# Patient Record
Sex: Female | Born: 1970 | Race: Black or African American | Hispanic: No | Marital: Married | Smoking: Never smoker
Health system: Southern US, Community
[De-identification: ages and names within clinical notes are randomized; demographics above are authoritative.]

## PROBLEM LIST (undated history)

## (undated) DIAGNOSIS — E119 Type 2 diabetes mellitus without complications: Secondary | ICD-10-CM

## (undated) DIAGNOSIS — T7840XA Allergy, unspecified, initial encounter: Secondary | ICD-10-CM

## (undated) DIAGNOSIS — F329 Major depressive disorder, single episode, unspecified: Secondary | ICD-10-CM

## (undated) DIAGNOSIS — E559 Vitamin D deficiency, unspecified: Secondary | ICD-10-CM

## (undated) DIAGNOSIS — F32A Depression, unspecified: Secondary | ICD-10-CM

## (undated) DIAGNOSIS — J302 Other seasonal allergic rhinitis: Secondary | ICD-10-CM

## (undated) DIAGNOSIS — M1990 Unspecified osteoarthritis, unspecified site: Secondary | ICD-10-CM

## (undated) DIAGNOSIS — G4730 Sleep apnea, unspecified: Secondary | ICD-10-CM

## (undated) DIAGNOSIS — A630 Anogenital (venereal) warts: Secondary | ICD-10-CM

## (undated) DIAGNOSIS — K219 Gastro-esophageal reflux disease without esophagitis: Secondary | ICD-10-CM

## (undated) DIAGNOSIS — I10 Essential (primary) hypertension: Secondary | ICD-10-CM

## (undated) HISTORY — DX: Allergy, unspecified, initial encounter: T78.40XA

## (undated) HISTORY — DX: Anogenital (venereal) warts: A63.0

## (undated) HISTORY — DX: Unspecified osteoarthritis, unspecified site: M19.90

## (undated) HISTORY — PX: TUBAL LIGATION: SHX77

## (undated) HISTORY — PX: DILATION AND CURETTAGE OF UTERUS: SHX78

---

## 2015-08-29 ENCOUNTER — Emergency Department (HOSPITAL_COMMUNITY)
Admission: EM | Admit: 2015-08-29 | Discharge: 2015-08-29 | Disposition: A | Discharge status: Home / Self Care | Payer: BLUE CROSS/BLUE SHIELD | Attending: Emergency Medicine | Admitting: Emergency Medicine

## 2015-08-29 ENCOUNTER — Encounter (HOSPITAL_COMMUNITY): Payer: Self-pay | Admitting: Nurse Practitioner

## 2015-08-29 ENCOUNTER — Emergency Department (HOSPITAL_COMMUNITY): Payer: BLUE CROSS/BLUE SHIELD

## 2015-08-29 DIAGNOSIS — D259 Leiomyoma of uterus, unspecified: Secondary | ICD-10-CM

## 2015-08-29 DIAGNOSIS — Z9851 Tubal ligation status: Secondary | ICD-10-CM | POA: Diagnosis not present

## 2015-08-29 DIAGNOSIS — R52 Pain, unspecified: Secondary | ICD-10-CM

## 2015-08-29 DIAGNOSIS — E119 Type 2 diabetes mellitus without complications: Secondary | ICD-10-CM | POA: Insufficient documentation

## 2015-08-29 DIAGNOSIS — R1031 Right lower quadrant pain: Secondary | ICD-10-CM | POA: Diagnosis not present

## 2015-08-29 DIAGNOSIS — R1110 Vomiting, unspecified: Secondary | ICD-10-CM | POA: Insufficient documentation

## 2015-08-29 DIAGNOSIS — R140 Abdominal distension (gaseous): Secondary | ICD-10-CM | POA: Insufficient documentation

## 2015-08-29 DIAGNOSIS — IMO0002 Reserved for concepts with insufficient information to code with codable children: Secondary | ICD-10-CM

## 2015-08-29 DIAGNOSIS — R229 Localized swelling, mass and lump, unspecified: Secondary | ICD-10-CM

## 2015-08-29 DIAGNOSIS — R1030 Lower abdominal pain, unspecified: Secondary | ICD-10-CM | POA: Diagnosis present

## 2015-08-29 DIAGNOSIS — Z8659 Personal history of other mental and behavioral disorders: Secondary | ICD-10-CM | POA: Insufficient documentation

## 2015-08-29 HISTORY — DX: Type 2 diabetes mellitus without complications: E11.9

## 2015-08-29 HISTORY — DX: Depression, unspecified: F32.A

## 2015-08-29 HISTORY — DX: Major depressive disorder, single episode, unspecified: F32.9

## 2015-08-29 HISTORY — DX: Vitamin D deficiency, unspecified: E55.9

## 2015-08-29 HISTORY — DX: Other seasonal allergic rhinitis: J30.2

## 2015-08-29 LAB — COMPREHENSIVE METABOLIC PANEL
ALT: 14 U/L (ref 14–54)
ANION GAP: 9 (ref 5–15)
AST: 15 U/L (ref 15–41)
Albumin: 4 g/dL (ref 3.5–5.0)
Alkaline Phosphatase: 68 U/L (ref 38–126)
BUN: 6 mg/dL (ref 6–20)
CHLORIDE: 98 mmol/L — AB (ref 101–111)
CO2: 26 mmol/L (ref 22–32)
Calcium: 9.4 mg/dL (ref 8.9–10.3)
Creatinine, Ser: 0.67 mg/dL (ref 0.44–1.00)
GFR calc non Af Amer: >60 mL/min (ref >60)
Glucose, Bld: 159 mg/dL — ABNORMAL HIGH (ref 65–99)
POTASSIUM: 3.9 mmol/L (ref 3.5–5.1)
SODIUM: 133 mmol/L — AB (ref 135–145)
Total Bilirubin: 0.5 mg/dL (ref 0.3–1.2)
Total Protein: 7.5 g/dL (ref 6.5–8.1)

## 2015-08-29 LAB — CBC
HCT: 40.4 % (ref 36.0–46.0)
HEMOGLOBIN: 13.3 g/dL (ref 12.0–15.0)
MCH: 28.1 pg (ref 26.0–34.0)
MCHC: 32.9 g/dL (ref 30.0–36.0)
MCV: 85.4 fL (ref 78.0–100.0)
Platelets: 299 10*3/uL (ref 150–400)
RBC: 4.73 MIL/uL (ref 3.87–5.11)
RDW: 14 % (ref 11.5–15.5)
WBC: 14.3 10*3/uL — AB (ref 4.0–10.5)

## 2015-08-29 LAB — I-STAT BETA HCG BLOOD, ED (MC, WL, AP ONLY): I-stat hCG, quantitative: <5 m[IU]/mL (ref <5)

## 2015-08-29 MED ORDER — ONDANSETRON HCL 4 MG/2ML IJ SOLN
4.0000 mg | Freq: Once | INTRAMUSCULAR | Status: AC
  Administered 2015-08-29: 4 mg via INTRAVENOUS
  Filled 2015-08-29: qty 2

## 2015-08-29 MED ORDER — HYDROMORPHONE HCL 1 MG/ML IJ SOLN
1.0000 mg | Freq: Once | INTRAMUSCULAR | Status: AC
  Administered 2015-08-29: 1 mg via INTRAVENOUS
  Filled 2015-08-29: qty 1

## 2015-08-29 MED ORDER — IOHEXOL 300 MG/ML  SOLN
80.0000 mL | Freq: Once | INTRAMUSCULAR | Status: AC | PRN
  Administered 2015-08-29: 80 mL via INTRAVENOUS

## 2015-08-29 MED ORDER — ONDANSETRON HCL 4 MG PO TABS: 4.0000 mg | ORAL_TABLET | Freq: Three times a day (TID) | ORAL | Status: DC | PRN

## 2015-08-29 MED ORDER — OXYCODONE-ACETAMINOPHEN 5-325 MG PO TABS: 1.0000 | ORAL_TABLET | ORAL | Status: DC | PRN

## 2015-08-29 MED ORDER — NAPROXEN 500 MG PO TABS: 500.0000 mg | ORAL_TABLET | Freq: Two times a day (BID) | ORAL | Status: DC

## 2015-08-29 NOTE — Discharge Instructions (Signed)
Abdominal (belly) pain can be caused by many things. Your caregiver performed an examination and possibly ordered blood/urine tests and imaging (CT scan, x-rays, ultrasound). Many cases can be observed and treated at home after initial evaluation in the emergency department. Even though you are being discharged home, abdominal pain can be unpredictable. Therefore, you need a repeated exam if your pain does not resolve, returns, or worsens. Most patients with abdominal pain don't have to be admitted to the hospital or have surgery, but serious problems like appendicitis and gallbladder attacks can start out as nonspecific pain. Many abdominal conditions cannot be diagnosed in one visit, so follow-up evaluations are very important. SEEK IMMEDIATE MEDICAL ATTENTION IF: The pain does not go away or becomes severe.  A temperature above 101 develops.  Repeated vomiting occurs (multiple episodes).  The pain becomes localized to portions of the abdomen. The right side could possibly be appendicitis. In an adult, the left lower portion of the abdomen could be colitis or diverticulitis.  Blood is being passed in stools or vomit (bright red or black tarry stools).  Return also if you develop chest pain, difficulty breathing, dizziness or fainting, or become confused, poorly responsive, or inconsolable (young children).     Uterine Fibroids Uterine fibroids are tissue masses (tumors) that can develop in the womb (uterus). They are also called leiomyomas. This type of tumor is not cancerous (benign) and does not spread to other parts of the body outside of the pelvic area, which is between the hip bones. Occasionally, fibroids may develop in the fallopian tubes, in the cervix, or on the support structures (ligaments) that surround the uterus. You can have one or many fibroids. Fibroids can vary in size, weight, and where they grow in the uterus. Some can become quite large. Most fibroids do not require medical  treatment. CAUSES A fibroid can develop when a single uterine cell keeps growing (replicating). Most cells in the human body have a control mechanism that keeps them from replicating without control. SIGNS AND SYMPTOMS Symptoms may include:   Heavy bleeding during your period.  Bleeding or spotting between periods.  Pelvic pain and pressure.  Bladder problems, such as needing to urinate more often (urinary frequency) or urgently.  Inability to reproduce offspring (infertility).  Miscarriages. DIAGNOSIS Uterine fibroids are diagnosed through a physical exam. Your health care provider may feel the lumpy tumors during a pelvic exam. Ultrasonography and an MRI may be done to determine the size, location, and number of fibroids. TREATMENT Treatment may include:  Watchful waiting. This involves getting the fibroid checked by your health care provider to see if it grows or shrinks. Follow your health care provider's recommendations for how often to have this checked.  Hormone medicines. These can be taken by mouth or given through an intrauterine device (IUD).  Surgery.  Removing the fibroids (myomectomy) or the uterus (hysterectomy).  Removing blood supply to the fibroids (uterine artery embolization). If fibroids interfere with your fertility and you want to become pregnant, your health care provider may recommend having the fibroids removed.  HOME CARE INSTRUCTIONS  Keep all follow-up visits as directed by your health care provider. This is important.  Take medicines only as directed by your health care provider.  If you were prescribed a hormone treatment, take the hormone medicines exactly as directed.  Do not take aspirin, because it can cause bleeding.  Ask your health care provider about taking iron pills and increasing the amount of dark green, leafy vegetables  in your diet. These actions can help to boost your blood iron levels, which may be affected by heavy menstrual  bleeding.  Pay close attention to your period and tell your health care provider about any changes, such as:  Increased blood flow that requires you to use more pads or tampons than usual per month.  A change in the number of days that your period lasts per month.  A change in symptoms that are associated with your period, such as abdominal cramping or back pain. SEEK MEDICAL CARE IF:  You have pelvic pain, back pain, or abdominal cramps that cannot be controlled with medicines.  You have an increase in bleeding between and during periods.  You soak tampons or pads in a half hour or less.  You feel lightheaded, extra tired, or weak. SEEK IMMEDIATE MEDICAL CARE IF:  You faint.  You have a sudden increase in pelvic pain.   This information is not intended to replace advice given to you by your health care provider. Make sure you discuss any questions you have with your health care provider.   Document Released: 09/16/2000 Document Revised: 10/10/2014 Document Reviewed: 03/18/2014 Elsevier Interactive Patient Education [employer].

## 2015-08-29 NOTE — ED Provider Notes (Signed)
CSN: [id number]     Arrival date & time 08/29/15  1602 History   First MD Initiated Contact with Patient 08/29/15 1725     Chief Complaint  Patient presents with  . Abdominal Pain     (Consider location/radiation/quality/duration/timing/severity/associated sxs/prior Treatment) HPI  Natasha Briggs is a(n) 44 y.o. female who presents with CC of back pain and lower abdominal pain. She  has a past medical history of Diabetes mellitus without complication ([place]); Depression; Seasonal allergies; and Vitamin D deficiency. The patient states that she normally has some low back pain but it is worse than ususal. % days ago she began having lower abdominal pain which is worse on the R side. She states that it had been intermittent but is now constant. She states that at time it feels like "I am having a baby." She began vomiting today and the pain became severe. She has pain with movement, coughing. She has only surgical history of gynecologic surgeries. The pain does not radiate. She denies urinary or vaginal symptoms. She states she has only made urine once today, which is abnormal for her.   Past Medical History  Diagnosis Date  . Diabetes mellitus without complication ([place])   . Depression   . Seasonal allergies   . Vitamin D deficiency    Past Surgical History  Procedure Laterality Date  . Tubal ligation    . Dilation and curettage of uterus     History reviewed. No pertinent family history. Social History  Substance Use Topics  . Smoking status: Never Smoker   . Smokeless tobacco: None  . Alcohol Use: Yes   OB History    No data available     Review of Systems  Ten systems reviewed and are negative for acute change, except as noted in the HPI.    Allergies  Review of patient's allergies indicates no known allergies.  Home Medications   Prior to Admission medications   Not on File   BP 120/70 mmHg  Pulse 79  Temp(Src) 98.2 F (36.8 C) (Oral)  Resp 18  Ht 5\' 4"  (1.626  m)  Wt 108.773 kg  BMI 41.14 kg/m2  SpO2 98% Physical Exam  Constitutional: She is oriented to person, place, and time. She appears well-developed and well-nourished. No distress.  Tearful  HENT:  Head: Normocephalic and atraumatic.  Eyes: Conjunctivae are normal. No scleral icterus.  Neck: Normal range of motion.  Cardiovascular: Normal rate, regular rhythm and normal heart sounds.  Exam reveals no gallop and no friction rub.   No murmur heard. Pulmonary/Chest: Effort normal and breath sounds normal. No respiratory distress.  Abdominal: Soft. Bowel sounds are normal. She exhibits no distension and no mass. There is tenderness. There is guarding.    Right lower quadrant of the abdomen is tender, distended and rigid. No CVA tenderness. Minimal tenderness in the other quadrants of the abdomen  Neurological: She is alert and oriented to person, place, and time.  Skin: Skin is warm and dry. She is not diaphoretic.  Nursing note and vitals reviewed.   ED Course  Procedures (including critical care time) Labs Review Labs Reviewed  COMPREHENSIVE METABOLIC PANEL - Abnormal; Notable for the following:    Sodium 133 (*)    Chloride 98 (*)    Glucose, Bld 159 (*)    All other components within normal limits  CBC - Abnormal; Notable for the following:    WBC 14.3 (*)    All other components within normal limits  URINALYSIS, ROUTINE W REFLEX MICROSCOPIC (NOT AT [facility])  I-STAT BETA HCG BLOOD, ED ([facility], [facility], [facility] ONLY)  POC URINE PREG, ED    Imaging Review No results found. I have personally reviewed and evaluated these images and lab results as part of my medical decision-making.   EKG Interpretation None      MDM   Final diagnoses:  Uterine leiomyoma, unspecified location    6:23 PM BP 120/70 mmHg  Pulse 79  Temp(Src) 98.2 F (36.8 C) (Oral)  Resp 18  Ht 5\' 4"  (1.626 m)  Wt 108.773 kg  BMI 41.14 kg/m2  SpO2 98% Patient with RLQ tenderness, guarding, rigidity.  ?  Peritonitis vs. Mass. She has a sig. Leukocytosis.   7:19 PM BP 109/56 mmHg  Pulse 88  Temp(Src) 98.2 F (36.8 C) (Oral)  Resp 20  Ht 5\' 4"  (1.626 m)  Wt 108.773 kg  BMI 41.14 kg/m2  SpO2 97% Patient US shows no torsion, +fibroids. No other abnormalities accounting for her pain.She appears safe for discharge. Follow up as soon as possible with OB/GYN I have discussed return precautions  The patient appears reasonably screened and/or stabilized for discharge and I doubt any other medical condition or other [facility] requiring further screening, evaluation, or treatment in the ED at this time prior to discharge.    Margarita Mail, PA-C 08/31/15 1724  Merrily Pew, MD 09/01/15 1256

## 2015-08-29 NOTE — ED Notes (Signed)
She c/o 3 day history lower abd, back pain, n/v, unable to tolerate any oral intake. Reports decreased urinary frequency. Denies bowel changes, fevers

## 2020-03-03 ENCOUNTER — Emergency Department (HOSPITAL_COMMUNITY)
Admission: EM | Admit: 2020-03-03 | Discharge: 2020-03-03 | Disposition: A | Discharge status: Home / Self Care | Payer: BC Managed Care – PPO | Attending: Emergency Medicine | Admitting: Emergency Medicine

## 2020-03-03 ENCOUNTER — Other Ambulatory Visit: Payer: Self-pay

## 2020-03-03 ENCOUNTER — Emergency Department (HOSPITAL_COMMUNITY): Payer: BC Managed Care – PPO

## 2020-03-03 ENCOUNTER — Encounter (HOSPITAL_COMMUNITY): Payer: Self-pay | Admitting: Emergency Medicine

## 2020-03-03 DIAGNOSIS — R079 Chest pain, unspecified: Secondary | ICD-10-CM | POA: Diagnosis present

## 2020-03-03 DIAGNOSIS — Z5321 Procedure and treatment not carried out due to patient leaving prior to being seen by health care provider: Secondary | ICD-10-CM | POA: Diagnosis not present

## 2020-03-03 LAB — CBC
HCT: 40.4 % (ref 36.0–46.0)
Hemoglobin: 13.1 g/dL (ref 12.0–15.0)
MCH: 28.2 pg (ref 26.0–34.0)
MCHC: 32.4 g/dL (ref 30.0–36.0)
MCV: 87.1 fL (ref 80.0–100.0)
Platelets: 294 10*3/uL (ref 150–400)
RBC: 4.64 MIL/uL (ref 3.87–5.11)
RDW: 14.8 % (ref 11.5–15.5)
WBC: 9.6 10*3/uL (ref 4.0–10.5)
nRBC: 0 % (ref 0.0–0.2)

## 2020-03-03 LAB — BASIC METABOLIC PANEL
Anion gap: 11 (ref 5–15)
BUN: 10 mg/dL (ref 6–20)
CO2: 26 mmol/L (ref 22–32)
Calcium: 9 mg/dL (ref 8.9–10.3)
Chloride: 102 mmol/L (ref 98–111)
Creatinine, Ser: 0.69 mg/dL (ref 0.44–1.00)
GFR calc Af Amer: >60 mL/min (ref >60)
GFR calc non Af Amer: >60 mL/min (ref >60)
Glucose, Bld: 96 mg/dL (ref 70–99)
Potassium: 3.9 mmol/L (ref 3.5–5.1)
Sodium: 139 mmol/L (ref 135–145)

## 2020-03-03 LAB — TROPONIN I (HIGH SENSITIVITY): Troponin I (High Sensitivity): <2 ng/L (ref <18)

## 2020-03-03 MED ORDER — SODIUM CHLORIDE 0.9% FLUSH: 3.0000 mL | Freq: Once | INTRAVENOUS | Status: DC

## 2020-03-03 NOTE — ED Notes (Signed)
BhCG <5. ISTAT not crossing over.

## 2020-03-03 NOTE — ED Triage Notes (Signed)
Patient arrives to ED with complaints of shortness of breath for the past month and chest tightness starting yesterday. Patient states the chest tightness comes and goes. Patient states the chest tightness is substernal.

## 2020-03-03 NOTE — ED Notes (Signed)
Pt is leaving due to wait time

## 2020-03-04 LAB — I-STAT BETA HCG BLOOD, ED (MC, WL, AP ONLY): I-stat hCG, quantitative: <5 m[IU]/mL (ref <5)

## 2021-06-07 ENCOUNTER — Encounter (HOSPITAL_BASED_OUTPATIENT_CLINIC_OR_DEPARTMENT_OTHER): Payer: Self-pay | Admitting: Emergency Medicine

## 2021-06-07 ENCOUNTER — Emergency Department (HOSPITAL_BASED_OUTPATIENT_CLINIC_OR_DEPARTMENT_OTHER): Payer: BC Managed Care – PPO | Admitting: Radiology

## 2021-06-07 ENCOUNTER — Other Ambulatory Visit: Payer: Self-pay

## 2021-06-07 ENCOUNTER — Emergency Department (HOSPITAL_BASED_OUTPATIENT_CLINIC_OR_DEPARTMENT_OTHER)
Admission: EM | Admit: 2021-06-07 | Discharge: 2021-06-07 | Disposition: A | Discharge status: Home / Self Care | Payer: BC Managed Care – PPO | Attending: Emergency Medicine | Admitting: Emergency Medicine

## 2021-06-07 DIAGNOSIS — I10 Essential (primary) hypertension: Secondary | ICD-10-CM | POA: Insufficient documentation

## 2021-06-07 DIAGNOSIS — Z20822 Contact with and (suspected) exposure to covid-19: Secondary | ICD-10-CM | POA: Diagnosis not present

## 2021-06-07 DIAGNOSIS — J069 Acute upper respiratory infection, unspecified: Secondary | ICD-10-CM | POA: Diagnosis not present

## 2021-06-07 DIAGNOSIS — Z79899 Other long term (current) drug therapy: Secondary | ICD-10-CM | POA: Diagnosis not present

## 2021-06-07 DIAGNOSIS — E119 Type 2 diabetes mellitus without complications: Secondary | ICD-10-CM | POA: Diagnosis not present

## 2021-06-07 DIAGNOSIS — R059 Cough, unspecified: Secondary | ICD-10-CM | POA: Diagnosis present

## 2021-06-07 HISTORY — DX: Gastro-esophageal reflux disease without esophagitis: K21.9

## 2021-06-07 HISTORY — DX: Essential (primary) hypertension: I10

## 2021-06-07 LAB — CBC WITH DIFFERENTIAL/PLATELET
Abs Immature Granulocytes: 0.04 10*3/uL (ref 0.00–0.07)
Basophils Absolute: 0.1 10*3/uL (ref 0.0–0.1)
Basophils Relative: 1 %
Eosinophils Absolute: 0.5 10*3/uL (ref 0.0–0.5)
Eosinophils Relative: 9 %
HCT: 37.5 % (ref 36.0–46.0)
Hemoglobin: 12.2 g/dL (ref 12.0–15.0)
Immature Granulocytes: 1 %
Lymphocytes Relative: 21 %
Lymphs Abs: 1.1 10*3/uL (ref 0.7–4.0)
MCH: 26.8 pg (ref 26.0–34.0)
MCHC: 32.5 g/dL (ref 30.0–36.0)
MCV: 82.2 fL (ref 80.0–100.0)
Monocytes Absolute: 0.6 10*3/uL (ref 0.1–1.0)
Monocytes Relative: 11 %
Neutro Abs: 3.2 10*3/uL (ref 1.7–7.7)
Neutrophils Relative %: 57 %
Platelets: 309 10*3/uL (ref 150–400)
RBC: 4.56 MIL/uL (ref 3.87–5.11)
RDW: 14.7 % (ref 11.5–15.5)
WBC: 5.4 10*3/uL (ref 4.0–10.5)
nRBC: 0 % (ref 0.0–0.2)

## 2021-06-07 LAB — RESP PANEL BY RT-PCR (FLU A&B, COVID) ARPGX2
Influenza A by PCR: NEGATIVE
Influenza B by PCR: NEGATIVE
SARS Coronavirus 2 by RT PCR: NEGATIVE

## 2021-06-07 LAB — BASIC METABOLIC PANEL
Anion gap: 9 (ref 5–15)
BUN: 11 mg/dL (ref 6–20)
CO2: 25 mmol/L (ref 22–32)
Calcium: 8.8 mg/dL — ABNORMAL LOW (ref 8.9–10.3)
Chloride: 100 mmol/L (ref 98–111)
Creatinine, Ser: 0.81 mg/dL (ref 0.44–1.00)
GFR, Estimated: >60 mL/min (ref >60)
Glucose, Bld: 270 mg/dL — ABNORMAL HIGH (ref 70–99)
Potassium: 3.9 mmol/L (ref 3.5–5.1)
Sodium: 134 mmol/L — ABNORMAL LOW (ref 135–145)

## 2021-06-07 LAB — BRAIN NATRIURETIC PEPTIDE: B Natriuretic Peptide: 20 pg/mL (ref 0.0–100.0)

## 2021-06-07 LAB — TROPONIN I (HIGH SENSITIVITY): Troponin I (High Sensitivity): 2 ng/L (ref <18)

## 2021-06-07 MED ORDER — ACETAMINOPHEN 325 MG PO TABS
650.0000 mg | ORAL_TABLET | Freq: Four times a day (QID) | ORAL | 0 refills | Status: DC | PRN
Start: 2021-06-07 — End: 2024-05-27

## 2021-06-07 MED ORDER — DEXAMETHASONE SODIUM PHOSPHATE 10 MG/ML IJ SOLN
10.0000 mg | Freq: Once | INTRAMUSCULAR | Status: AC
  Administered 2021-06-07: 10 mg
  Filled 2021-06-07: qty 1

## 2021-06-07 MED ORDER — IPRATROPIUM-ALBUTEROL 0.5-2.5 (3) MG/3ML IN SOLN
3.0000 mL | Freq: Once | RESPIRATORY_TRACT | Status: AC
  Administered 2021-06-07: 3 mL via RESPIRATORY_TRACT
  Filled 2021-06-07: qty 3

## 2021-06-07 MED ORDER — SODIUM CHLORIDE 0.9 % IV BOLUS
1000.0000 mL | Freq: Once | INTRAVENOUS | Status: AC
  Administered 2021-06-07: 1000 mL via INTRAVENOUS

## 2021-06-07 MED ORDER — KETOROLAC TROMETHAMINE 15 MG/ML IJ SOLN
15.0000 mg | Freq: Once | INTRAMUSCULAR | Status: AC
  Administered 2021-06-07: 15 mg via INTRAVENOUS
  Filled 2021-06-07: qty 1

## 2021-06-07 MED ORDER — GUAIFENESIN 100 MG/5ML PO SOLN
5.0000 mL | ORAL | 0 refills | Status: AC | PRN
Start: 2021-06-07 — End: ?

## 2021-06-07 NOTE — ED Triage Notes (Signed)
Pt to ER with c/o cough and SHOB that started on Wednesday and has been getting increasingly worse.  Pt states was difficult to sleep last night due to [facility]. Pt states cough is congested, but not productive.

## 2021-06-07 NOTE — ED Provider Notes (Signed)
[place] EMERGENCY DEPT Provider Note   CSN: [id number] Arrival date & time: 06/07/21  1217     History Chief Complaint  Patient presents with   Shortness of Breath    Natasha Briggs is a 50 y.o. female.  50 year old female with history of hypertension, acid reflux, diabetes pain to the ER secondary to cough, congestion, malaise over the past 4 to 5 days.  Patient with known sick contacts with family member with viral upper respiratory tract infection.  She has received COVID-19 immunization and booster.  Patient with history of underlying asthma uses inhaler sparingly.  No history of COPD.  No home oxygen use.  Patient reports that when she is coughing she has difficulty breathing but otherwise she is breathing appropriately.  No chest pain, fevers, chills, nausea or vomiting.  She is eating oral intake appropriately.  No change in bowel or bladder function.  No rashes, no recent travel.  She does not smoke tobacco  The history is provided by the spouse and the patient. No language interpreter was used.  Shortness of Breath Associated symptoms: cough   Associated symptoms: no abdominal pain, no chest pain, no fever, no headaches and no rash       Past Medical History:  Diagnosis Date   Depression    Diabetes mellitus without complication (HCC)    GERD (gastroesophageal reflux disease)    Hypertension    Seasonal allergies    Vitamin D deficiency     There are no problems to display for this patient.   Past Surgical History:  Procedure Laterality Date   DILATION AND CURETTAGE OF UTERUS     TUBAL LIGATION       OB History   No obstetric history on file.     History reviewed. No pertinent family history.  Social History   Tobacco Use   Smoking status: Never   Smokeless tobacco: Never  Substance Use Topics   Alcohol use: Yes   Drug use: No    Home Medications Prior to Admission medications   Medication Sig Start Date End Date Taking?  Authorizing Provider  acetaminophen (TYLENOL) 325 MG tablet Take 2 tablets (650 mg total) by mouth every 6 (six) hours as needed. 06/07/21  Yes Jeanell Sparrow, DO  citalopram (CELEXA) 20 MG tablet citalopram 20 mg tablet   1 tablet twice a day by oral route. 03/07/09  Yes [provider]  guaiFENesin (ROBITUSSIN) 100 MG/5ML SOLN Take 5 mLs (100 mg total) by mouth every 4 (four) hours as needed for cough or to loosen phlegm. 06/07/21  Yes Wynona Dove A, DO  pantoprazole (PROTONIX) 40 MG tablet Protonix 40 mg tablet,delayed release   1 tablet twice a day by oral route. 03/14/16  Yes [provider]  pravastatin (PRAVACHOL) 10 MG tablet pravastatin 10 mg tablet   1 tablet every day by oral route. 12/06/18  Yes [provider]  amLODipine (NORVASC) 2.5 MG tablet Take 2.5 mg by mouth daily. 02/25/21   [provider]  glyBURIDE (DIABETA) 5 MG tablet Take 2.5 mg by mouth daily as needed. 04/13/21   [provider]  loratadine (CLARITIN) 10 MG tablet Claritin 10 mg tablet   1 tablet every day by oral route.    [provider]  naproxen (NAPROSYN) 500 MG tablet Take 1 tablet (500 mg total) by mouth 2 (two) times daily with a meal. 08/29/15   Harris, Abigail, PA-C  ondansetron (ZOFRAN) 4 MG tablet Take 1 tablet (  4 mg total) by mouth every 8 (eight) hours as needed for nausea or vomiting. 08/29/15   Margarita Mail, PA-C  oxyCODONE-acetaminophen (PERCOCET) 5-325 MG tablet Take 1-2 tablets by mouth every 4 (four) hours as needed. 08/29/15   Margarita Mail, PA-C    Allergies    Patient has no known allergies.  Review of Systems   Review of Systems  Constitutional:  Negative for activity change and fever.  HENT:  Positive for congestion and postnasal drip. Negative for facial swelling and trouble swallowing.   Eyes:  Negative for discharge and redness.  Respiratory:  Positive for cough and shortness of breath.   Cardiovascular:  Negative for chest pain and  palpitations.  Gastrointestinal:  Negative for abdominal pain and nausea.  Genitourinary:  Negative for dysuria and flank pain.  Musculoskeletal:  Negative for back pain and gait problem.  Skin:  Negative for pallor and rash.  Neurological:  Negative for syncope and headaches.   Physical Exam Updated Vital Signs BP 105/60   Pulse 92   Temp 99.2 F (37.3 C) (Oral)   Resp 18   Ht '5\' 4"'$  (1.626 m)   Wt 112 kg   LMP 05/07/2021 (Exact Date)   SpO2 93%   BMI 42.40 kg/m   Physical Exam Vitals and nursing note reviewed.  Constitutional:      General: She is not in acute distress.    Appearance: Normal appearance. She is well-developed. She is not ill-appearing.  HENT:     Head: Normocephalic and atraumatic.     Right Ear: External ear normal.     Left Ear: External ear normal.     Nose: Nose normal.     Mouth/Throat:     Mouth: Mucous membranes are moist.     Pharynx: Oropharynx is clear.  Eyes:     General: No scleral icterus.       Right eye: No discharge.        Left eye: No discharge.  Cardiovascular:     Rate and Rhythm: Normal rate and regular rhythm.     Pulses: Normal pulses.     Heart sounds: Normal heart sounds.  Pulmonary:     Effort: Pulmonary effort is normal. No accessory muscle usage or respiratory distress.     Breath sounds: No stridor. Wheezing present. No rhonchi or rales.  Abdominal:     General: Abdomen is flat.     Tenderness: There is no abdominal tenderness.  Musculoskeletal:        General: Normal range of motion.     Cervical back: Normal range of motion.     Right lower leg: No edema.     Left lower leg: No edema.  Skin:    General: Skin is warm and dry.     Capillary Refill: Capillary refill takes less than 2 seconds.  Neurological:     Mental Status: She is alert and oriented to person, place, and time.     GCS: GCS eye subscore is 4. GCS verbal subscore is 5. GCS motor subscore is 6.  Psychiatric:        Mood and Affect: Mood normal.         Behavior: Behavior normal.    ED Results / Procedures / Treatments   Labs (all labs ordered are listed, but only abnormal results are displayed) Labs Reviewed  BASIC METABOLIC PANEL - Abnormal; Notable for the following components:      Result Value   Sodium 134 (*)  Glucose, Bld 270 (*)    Calcium 8.8 (*)    All other components within normal limits  RESP PANEL BY RT-PCR (FLU A&B, COVID) ARPGX2  CBC WITH DIFFERENTIAL/PLATELET  BRAIN NATRIURETIC PEPTIDE  TROPONIN I (HIGH SENSITIVITY)  TROPONIN I (HIGH SENSITIVITY)    EKG EKG Interpretation  Date/Time:  Monday June 07 2021 12:56:09 EDT Ventricular Rate:  91 PR Interval:  169 QRS Duration: 86 QT Interval:  372 QTC Calculation: 458 R Axis:   -51 Text Interpretation: Sinus rhythm Borderline T abnormalities, inferior leads Similar to prior tracing Confirmed by Wynona Dove ([id number]) on 06/07/2021 3:06:36 PM  Radiology DG Chest 2 View  Result Date: 06/07/2021 CLINICAL DATA:  Cough.  Shortness of breath. EXAM: CHEST - 2 VIEW COMPARISON:  03/03/2021 FINDINGS: Heart size normal. Lung volumes are low. No edema or effusion is present. No focal airspace disease present. Axial skeleton is within normal limits. IMPRESSION: 1. Low lung volumes. 2. No acute cardiopulmonary disease. Electronically Signed   By: San Morelle M.D.   On: 06/07/2021 13:26    Procedures Procedures   Medications Ordered in ED Medications  sodium chloride 0.9 % bolus 1,000 mL (0 mLs Intravenous Stopped 06/07/21 1417)  ipratropium-albuterol (DUONEB) 0.5-2.5 (3) MG/3ML nebulizer solution 3 mL (3 mLs Nebulization Given 06/07/21 1253)  ketorolac (TORADOL) 15 MG/ML injection 15 mg (15 mg Intravenous Given 06/07/21 1304)  dexamethasone (DECADRON) injection 10 mg (10 mg Other Given 06/07/21 1447)    ED Course  I have reviewed the triage vital signs and the nursing notes.  Pertinent labs & imaging results that were available during my care of the patient were  reviewed by me and considered in my medical decision making (see chart for details).    MDM Rules/Calculators/A&P                          50 year old female with history as above presented ER secondary to cough congestion, URI symptoms.  No current chest pain.  She is breathing comfortably on room air.  Will transiently desaturate while actively coughing but when she stops coughing she will have normal pulse ox.  Vital signs reviewed.  Serious etiology considered.  Troponin is not acutely elevated.  Symptoms of been ongoing for multiple days.  EKG with evidence acute ischemia.  X-ray nonacute.  ACS is unlikely at this time.  Wells score is low, given infectious symptoms clinically doubt PE at this time.  Labs reviewed and overall reassuring.  No evidence of sepsis.  X-ray without evidence of pneumonia.  Patient denies history of COPD, no history of chronic tobacco use.  She has distant history of asthma but does not use inhaler regularly or follow with pulmonology.  Patient reports her symptoms have improved following intervention in the emergency department.  Given recent exposure to sick contact, favor viral upper respiratory infection as etiology of her symptoms today.  Potentially viral bronchitis.  Patient given steroids in the emergency department.  Suppressant for home.  Discussed supportive care and close PCP follow-up.    The patient improved significantly and was discharged in stable condition. Detailed discussions were had with the patient regarding current findings, and need for close f/u with PCP or on call doctor. The patient has been instructed to return immediately if the symptoms worsen in any way for re-evaluation. Patient verbalized understanding and is in agreement with current care plan. All questions answered prior to discharge.    Final Clinical Impression(s) /  ED Diagnoses Final diagnoses:  Viral URI with cough    Rx / DC Orders ED Discharge Orders           Ordered    guaiFENesin (ROBITUSSIN) 100 MG/5ML SOLN  Every 4 hours PRN        06/07/21 1450    acetaminophen (TYLENOL) 325 MG tablet  Every 6 hours PRN        06/07/21 1451             Jeanell Sparrow, DO 06/07/21 1510

## 2021-06-09 ENCOUNTER — Emergency Department (HOSPITAL_BASED_OUTPATIENT_CLINIC_OR_DEPARTMENT_OTHER)
Admission: EM | Admit: 2021-06-09 | Discharge: 2021-06-10 | Disposition: A | Discharge status: Home / Self Care | Payer: BC Managed Care – PPO | Attending: Emergency Medicine | Admitting: Emergency Medicine

## 2021-06-09 ENCOUNTER — Encounter (HOSPITAL_BASED_OUTPATIENT_CLINIC_OR_DEPARTMENT_OTHER): Payer: Self-pay | Admitting: *Deleted

## 2021-06-09 ENCOUNTER — Emergency Department (HOSPITAL_BASED_OUTPATIENT_CLINIC_OR_DEPARTMENT_OTHER): Payer: BC Managed Care – PPO

## 2021-06-09 ENCOUNTER — Emergency Department (HOSPITAL_BASED_OUTPATIENT_CLINIC_OR_DEPARTMENT_OTHER): Payer: BC Managed Care – PPO | Admitting: Radiology

## 2021-06-09 ENCOUNTER — Other Ambulatory Visit: Payer: Self-pay

## 2021-06-09 DIAGNOSIS — Z79899 Other long term (current) drug therapy: Secondary | ICD-10-CM | POA: Diagnosis not present

## 2021-06-09 DIAGNOSIS — R0602 Shortness of breath: Secondary | ICD-10-CM | POA: Diagnosis not present

## 2021-06-09 DIAGNOSIS — I10 Essential (primary) hypertension: Secondary | ICD-10-CM | POA: Diagnosis not present

## 2021-06-09 DIAGNOSIS — J209 Acute bronchitis, unspecified: Secondary | ICD-10-CM

## 2021-06-09 DIAGNOSIS — R1013 Epigastric pain: Secondary | ICD-10-CM | POA: Diagnosis not present

## 2021-06-09 DIAGNOSIS — E119 Type 2 diabetes mellitus without complications: Secondary | ICD-10-CM | POA: Diagnosis not present

## 2021-06-09 DIAGNOSIS — R059 Cough, unspecified: Secondary | ICD-10-CM | POA: Diagnosis present

## 2021-06-09 LAB — COMPREHENSIVE METABOLIC PANEL
ALT: 13 U/L (ref 0–44)
AST: 13 U/L — ABNORMAL LOW (ref 15–41)
Albumin: 4.1 g/dL (ref 3.5–5.0)
Alkaline Phosphatase: 50 U/L (ref 38–126)
Anion gap: 9 (ref 5–15)
BUN: 14 mg/dL (ref 6–20)
CO2: 26 mmol/L (ref 22–32)
Calcium: 9.1 mg/dL (ref 8.9–10.3)
Chloride: 102 mmol/L (ref 98–111)
Creatinine, Ser: 0.64 mg/dL (ref 0.44–1.00)
GFR, Estimated: >60 mL/min (ref >60)
Glucose, Bld: 145 mg/dL — ABNORMAL HIGH (ref 70–99)
Potassium: 4.3 mmol/L (ref 3.5–5.1)
Sodium: 137 mmol/L (ref 135–145)
Total Bilirubin: 0.3 mg/dL (ref 0.3–1.2)
Total Protein: 7.7 g/dL (ref 6.5–8.1)

## 2021-06-09 LAB — LIPASE, BLOOD: Lipase: 29 U/L (ref 11–51)

## 2021-06-09 LAB — CBC
HCT: 37.8 % (ref 36.0–46.0)
Hemoglobin: 12.2 g/dL (ref 12.0–15.0)
MCH: 26.6 pg (ref 26.0–34.0)
MCHC: 32.3 g/dL (ref 30.0–36.0)
MCV: 82.5 fL (ref 80.0–100.0)
Platelets: 338 10*3/uL (ref 150–400)
RBC: 4.58 MIL/uL (ref 3.87–5.11)
RDW: 15 % (ref 11.5–15.5)
WBC: 8.4 10*3/uL (ref 4.0–10.5)
nRBC: 0 % (ref 0.0–0.2)

## 2021-06-09 MED ORDER — DOXYCYCLINE HYCLATE 100 MG PO TABS
100.0000 mg | ORAL_TABLET | Freq: Once | ORAL | Status: AC
  Administered 2021-06-10: 100 mg via ORAL
  Filled 2021-06-09: qty 1

## 2021-06-09 MED ORDER — DOXYCYCLINE HYCLATE 100 MG PO CAPS: 100.0000 mg | ORAL_CAPSULE | Freq: Two times a day (BID) | ORAL | 0 refills | Status: DC

## 2021-06-09 MED ORDER — AEROCHAMBER PLUS FLO-VU MEDIUM MISC
1.0000 | Freq: Once | Status: AC
  Administered 2021-06-10: 1
  Filled 2021-06-09: qty 1

## 2021-06-09 MED ORDER — ALBUTEROL SULFATE HFA 108 (90 BASE) MCG/ACT IN AERS
2.0000 | INHALATION_SPRAY | RESPIRATORY_TRACT | Status: DC | PRN
  Administered 2021-06-10: 2 via RESPIRATORY_TRACT
  Filled 2021-06-09: qty 6.7

## 2021-06-09 MED ORDER — IOHEXOL 350 MG/ML SOLN
100.0000 mL | Freq: Once | INTRAVENOUS | Status: AC | PRN
  Administered 2021-06-09: 100 mL via INTRAVENOUS

## 2021-06-09 MED ORDER — PREDNISONE 10 MG PO TABS: 20.0000 mg | ORAL_TABLET | Freq: Every day | ORAL | 0 refills | Status: DC

## 2021-06-09 NOTE — ED Provider Notes (Signed)
[facility] DEPT Provider Note   CSN: [id number] Arrival date & time: 06/09/21  1748     History Chief Complaint  Patient presents with   Abdominal Pain    Natasha Briggs is a 50 y.o. female.  HPI  50 year old female history of hypertension, acid reflux, diabetes presents today with pain in her lower chest with coughing and deep breathing.  She has had a 6 to 7-day history of cough, congestion, generalized belies.  She was seen here in the ED 2 days ago.  At that time she had an elevated glucose at 270, normal CBC, normal troponin, negative COVID test, and clear chest x-Daymeon Fischman.  She has continued to have coughing and feel generally poor but has not had a fever or chills.  Today she began having some pain in her upper abdomen to lower chest.  She has not had any associated nausea, vomiting, loss of appetite or new fever.    Past Medical History:  Diagnosis Date   Depression    Diabetes mellitus without complication (HCC)    GERD (gastroesophageal reflux disease)    Hypertension    Seasonal allergies    Vitamin D deficiency     There are no problems to display for this patient.   Past Surgical History:  Procedure Laterality Date   DILATION AND CURETTAGE OF UTERUS     TUBAL LIGATION       OB History   No obstetric history on file.     No family history on file.  Social History   Tobacco Use   Smoking status: Never   Smokeless tobacco: Never  Vaping Use   Vaping Use: Never used  Substance Use Topics   Alcohol use: Yes   Drug use: No    Home Medications Prior to Admission medications   Medication Sig Start Date End Date Taking? Authorizing Provider  amLODipine (NORVASC) 2.5 MG tablet Take 2.5 mg by mouth daily. 02/25/21  Yes [provider]  citalopram (CELEXA) 20 MG tablet citalopram 20 mg tablet   1 tablet twice a day by oral route. 03/07/09  Yes [provider]  glyBURIDE (DIABETA) 5 MG tablet Take 2.5 mg by mouth daily as  needed. 04/13/21  Yes [provider]  guaiFENesin (ROBITUSSIN) 100 MG/5ML SOLN Take 5 mLs (100 mg total) by mouth every 4 (four) hours as needed for cough or to loosen phlegm. 06/07/21  Yes Wynona Dove A, DO  loratadine (CLARITIN) 10 MG tablet Claritin 10 mg tablet   1 tablet every day by oral route.   Yes [provider]  naproxen (NAPROSYN) 500 MG tablet Take 1 tablet (500 mg total) by mouth 2 (two) times daily with a meal. 08/29/15  Yes Harris, Abigail, PA-C  pantoprazole (PROTONIX) 40 MG tablet Protonix 40 mg tablet,delayed release   1 tablet twice a day by oral route. 03/14/16  Yes [provider]  pravastatin (PRAVACHOL) 10 MG tablet pravastatin 10 mg tablet   1 tablet every day by oral route. 12/06/18  Yes [provider]  acetaminophen (TYLENOL) 325 MG tablet Take 2 tablets (650 mg total) by mouth every 6 (six) hours as needed. 06/07/21   Jeanell Sparrow, DO    Allergies    Patient has no known allergies.  Review of Systems   Review of Systems  Physical Exam Updated Vital Signs BP 121/73   Pulse 79   Temp 98.6 F (37 C)   Resp 20   Ht 1.626 m (  $'5\' 4"'K$ )   Wt 112 kg   LMP 06/08/2021 (Exact Date)   SpO2 97%   BMI 42.40 kg/m   Physical Exam Vitals and nursing note reviewed.  Constitutional:      Appearance: She is well-developed. She is obese.  HENT:     Head: Normocephalic.     Mouth/Throat:     Mouth: Mucous membranes are moist.  Eyes:     Extraocular Movements: Extraocular movements intact.  Cardiovascular:     Rate and Rhythm: Normal rate and regular rhythm.  Pulmonary:     Effort: Pulmonary effort is normal.     Breath sounds: Rhonchi present.  Abdominal:     General: Abdomen is flat. Bowel sounds are normal.     Palpations: Abdomen is soft.  Skin:    General: Skin is warm and dry.     Capillary Refill: Capillary refill takes less than 2 seconds.  Neurological:     General: No focal deficit present.     Mental Status: She is  alert.  Psychiatric:        Mood and Affect: Mood normal.    ED Results / Procedures / Treatments   Labs (all labs ordered are listed, but only abnormal results are displayed) Labs Reviewed  COMPREHENSIVE METABOLIC PANEL - Abnormal; Notable for the following components:      Result Value   Glucose, Bld 145 (*)    AST 13 (*)    All other components within normal limits  CBC  LIPASE, BLOOD    EKG None  Radiology DG Chest 2 View  Result Date: 06/09/2021 CLINICAL DATA:  Chest pain EXAM: CHEST - 2 VIEW COMPARISON:  06/07/2021 FINDINGS: Heart and mediastinal contours are within normal limits. No focal opacities or effusions. No acute bony abnormality. IMPRESSION: No active cardiopulmonary disease. Electronically Signed   By: Rolm Baptise M.D.   On: 06/09/2021 22:01   CT Angio Chest PE W and/or Wo Contrast  Result Date: 06/09/2021 CLINICAL DATA:  PE suspected, low/intermediate prob, positive D-dimer EXAM: CT ANGIOGRAPHY CHEST WITH CONTRAST TECHNIQUE: Multidetector CT imaging of the chest was performed using the standard protocol during bolus administration of intravenous contrast. Multiplanar CT image reconstructions and MIPs were obtained to evaluate the vascular anatomy. CONTRAST:  139m OMNIPAQUE IOHEXOL 350 MG/ML SOLN COMPARISON:  None. FINDINGS: Cardiovascular: No filling defects in the pulmonary arteries to suggest pulmonary emboli. Heart is normal size. Aorta is normal caliber. Mediastinum/Nodes: No mediastinal, hilar, or axillary adenopathy. Trachea and esophagus are unremarkable. Thyroid unremarkable. Lungs/Pleura: Mild elevation of the right hemidiaphragm. Diffuse peribronchial thickening. Probable mucous plugging in the right lower lobe rhonchi. No confluent opacities or effusions. Upper Abdomen: Imaging into the upper abdomen demonstrates no acute findings. Musculoskeletal: Chest wall soft tissues are unremarkable. No acute bony abnormality. Review of the MIP images confirms the above  findings. IMPRESSION: No evidence of pulmonary embolus. Airway thickening compatible with bronchitis. Probable mucous plugging in the right lower lobe. Elevation of the right hemidiaphragm. Electronically Signed   By: KRolm BaptiseM.D.   On: 06/09/2021 23:24   CT ABDOMEN PELVIS W CONTRAST  Result Date: 06/09/2021 CLINICAL DATA:  Right and left upper quadrant pain. EXAM: CT ABDOMEN AND PELVIS WITH CONTRAST TECHNIQUE: Multidetector CT imaging of the abdomen and pelvis was performed using the standard protocol following bolus administration of intravenous contrast. CONTRAST:  1031mOMNIPAQUE IOHEXOL 350 MG/ML SOLN COMPARISON:  None. FINDINGS: Lower chest: Elevation of the right hemidiaphragm. No effusions. Airway thickening in the  lower lobes. No confluent opacities. Hepatobiliary: No focal hepatic abnormality. Gallbladder unremarkable. Pancreas: No focal abnormality or ductal dilatation. Spleen: No focal abnormality.  Normal size. Adrenals/Urinary Tract: No adrenal abnormality. No focal renal abnormality. No stones or hydronephrosis. Urinary bladder is unremarkable. Stomach/Bowel: Normal appendix. Stomach, large and small bowel grossly unremarkable. Vascular/Lymphatic: No evidence of aneurysm or adenopathy. Scattered aortic calcifications. Reproductive: Fibroid uterus. 3 cm cyst in the left ovary. No no right adnexal mass. Other: No free fluid or free air. Musculoskeletal: Degenerative disc and facet disease at L5-S1 with 6 mm anterolisthesis of L5 on S1. No acute bony abnormality. IMPRESSION: No acute findings in the abdomen or pelvis. Fibroid uterus. 3 cm left ovarian cyst. Aortic atherosclerosis. Electronically Signed   By: Rolm Baptise M.D.   On: 06/09/2021 23:27    Procedures Procedures   Medications Ordered in ED Medications  iohexol (OMNIPAQUE) 350 MG/ML injection 100 mL (100 mLs Intravenous Contrast Given 06/09/21 2308)    ED Course  I have reviewed the triage vital signs and the nursing  notes.  Pertinent labs & imaging results that were available during my care of the patient were reviewed by me and considered in my medical decision making (see chart for details).    MDM Rules/Calculators/A&P                           50 year old female with upper respiratory infection presents today with worsening pain with inspiration and coughing.  She had some epigastric discomfort.  Labs obtained and normal except for glucose of 145.  She is recently had a negative COVID test with same symptoms.  Today she had CTA and CT abdomen.  CTA significant bronchitis but no evidence of PE or focal consolidation.  Abdomen appears normal.  Patient is remained hemodynamically stable here in the ED.  Plan albuterol, doxycycline, and prednisone.  We discussed return precautions and need for follow-up she voiced understanding Final Clinical Impression(s) / ED Diagnoses Final diagnoses:  Acute bronchitis, unspecified organism    Rx / DC Orders ED Discharge Orders     None        Pattricia Boss, MD 06/09/21 2354

## 2021-06-09 NOTE — ED Triage Notes (Signed)
Right and left upper quadrant pain this morning.  Denies nausea or vomiting.

## 2021-06-10 DIAGNOSIS — J209 Acute bronchitis, unspecified: Secondary | ICD-10-CM | POA: Diagnosis not present

## 2021-06-10 NOTE — ED Notes (Signed)
RT educated pt on proper use of MDI w/Spacer. RT explained to pt step by step and pt was able to perform without difficulty.

## 2021-08-30 ENCOUNTER — Encounter: Payer: Self-pay | Admitting: Internal Medicine

## 2021-09-17 ENCOUNTER — Other Ambulatory Visit: Payer: Self-pay

## 2021-09-17 ENCOUNTER — Ambulatory Visit (INDEPENDENT_AMBULATORY_CARE_PROVIDER_SITE_OTHER): Payer: BC Managed Care – PPO | Admitting: Podiatry

## 2021-09-17 ENCOUNTER — Ambulatory Visit (INDEPENDENT_AMBULATORY_CARE_PROVIDER_SITE_OTHER): Payer: BC Managed Care – PPO

## 2021-09-17 DIAGNOSIS — M79671 Pain in right foot: Secondary | ICD-10-CM

## 2021-09-17 DIAGNOSIS — M79672 Pain in left foot: Secondary | ICD-10-CM | POA: Diagnosis not present

## 2021-09-17 DIAGNOSIS — Q666 Other congenital valgus deformities of feet: Secondary | ICD-10-CM

## 2021-09-17 DIAGNOSIS — M21619 Bunion of unspecified foot: Secondary | ICD-10-CM

## 2021-09-22 NOTE — Progress Notes (Signed)
Subjective:  Patient ID: Natasha Briggs, female    DOB: [birth year],  MRN: [id number]  Chief Complaint  Patient presents with   Bunions    Bilateral bunion pain     50 y.o. female presents with the above complaint.  Patient presents with complaint bilateral bunion right greater than left side.  Patient states that it has been causing her pain especially with ambulation.  She wanted get evaluated discuss treatment options.  She would like to do conservative treatment options for now.  She will think about surgical options.  Pain scale is 5 out of 10 hurts with ambulation hurts when rubbing on it.  She is tried some shoe gear modification but not religiously.  She would like to discuss orthotics she does not wear any.  She denies any other acute complaints.  Review of Systems: Negative except as noted in the HPI. Denies N/V/F/Ch.  Past Medical History:  Diagnosis Date   Depression    Diabetes mellitus without complication (HCC)    GERD (gastroesophageal reflux disease)    Hypertension    Seasonal allergies    Vitamin D deficiency     Current Outpatient Medications:    acetaminophen (TYLENOL) 325 MG tablet, Take 2 tablets (650 mg total) by mouth every 6 (six) hours as needed., Disp: 36 tablet, Rfl: 0   amLODipine (NORVASC) 2.5 MG tablet, Take 2.5 mg by mouth daily., Disp: , Rfl:    citalopram (CELEXA) 20 MG tablet, citalopram 20 mg tablet   1 tablet twice a day by oral route., Disp: , Rfl:    doxycycline (VIBRAMYCIN) 100 MG capsule, Take 1 capsule (100 mg total) by mouth 2 (two) times daily., Disp: 20 capsule, Rfl: 0   glyBURIDE (DIABETA) 5 MG tablet, Take 2.5 mg by mouth daily as needed., Disp: , Rfl:    guaiFENesin (ROBITUSSIN) 100 MG/5ML SOLN, Take 5 mLs (100 mg total) by mouth every 4 (four) hours as needed for cough or to loosen phlegm., Disp: 236 mL, Rfl: 0   loratadine (CLARITIN) 10 MG tablet, Claritin 10 mg tablet   1 tablet every day by oral route., Disp: , Rfl:    naproxen  (NAPROSYN) 500 MG tablet, Take 1 tablet (500 mg total) by mouth 2 (two) times daily with a meal., Disp: 30 tablet, Rfl: 0   pantoprazole (PROTONIX) 40 MG tablet, Protonix 40 mg tablet,delayed release   1 tablet twice a day by oral route., Disp: , Rfl:    pravastatin (PRAVACHOL) 10 MG tablet, pravastatin 10 mg tablet   1 tablet every day by oral route., Disp: , Rfl:    predniSONE (DELTASONE) 10 MG tablet, Take 2 tablets (20 mg total) by mouth daily., Disp: 15 tablet, Rfl: 0  Social History   Tobacco Use  Smoking Status Never  Smokeless Tobacco Never    No Known Allergies Objective:  There were no vitals filed for this visit. There is no height or weight on file to calculate BMI. Constitutional Well developed. Well nourished.  Vascular Dorsalis pedis pulses palpable bilaterally. Posterior tibial pulses palpable bilaterally. Capillary refill normal to all digits.  No cyanosis or clubbing noted. Pedal hair growth normal.  Neurologic Normal speech. Oriented to person, place, and time. Epicritic sensation to light touch grossly present bilaterally.  Dermatologic Nails well groomed and normal in appearance. No open wounds. No skin lesions.  Orthopedic: Normal joint ROM without pain or crepitus bilaterally. Hallux abductovalgus deformity present bilateral right greater than left side this is a track bound  deformity not a tracking deformity.  No intra-articular first MPJ pain noted bilaterally. Left 1st MPJ diminished range of motion. Left 1st TMT without gross hypermobility. Right 1st MPJ diminished range of motion  Right 1st TMT without gross hypermobility. Lesser digital contractures absent bilaterally.   Radiographs: Taken and reviewed. Hallux abductovalgus deformity present. Metatarsal parabola normal. 1st/2nd IMA: Moderate 12 to 15 degrees; TSP: 5 out of 7.  There is mild increase in hallux abductus angle  Assessment:   1. Bunion   2. Pes planovalgus    Plan:  Patient was  evaluated and treated and all questions answered.  Hallux abductovalgus deformity, right greater than left side -I discussed conservative treatment options including orthotics management.  At this time given that patient only has mild to moderate pain I discussed shoe gear modification extensive detail.  I discussed bunion padding as well.  She states she would like to do some conservative treatment options prior to undergoing surgical options.  She will think about the surgical options.  Pes planovalgus -I explained to patient the etiology of pes planovalgus and relationship with bunion deformity and various treatment options were discussed.  Given patient foot structure in the setting of bunion deformity I believe patient will benefit from custom-made orthotics to help control the hindfoot motion support the arch of the foot and take the stress away from bunion deformity patient agrees with the plan like to proceed with orthotics -Patient was casted for orthotics   No follow-ups on file.

## 2021-09-23 ENCOUNTER — Other Ambulatory Visit: Payer: Self-pay | Admitting: Otolaryngology

## 2021-09-23 DIAGNOSIS — E041 Nontoxic single thyroid nodule: Secondary | ICD-10-CM

## 2021-10-07 ENCOUNTER — Ambulatory Visit
Admission: RE | Admit: 2021-10-07 | Discharge: 2021-10-07 | Disposition: A | Discharge status: Home / Self Care | Payer: BC Managed Care – PPO | Source: Ambulatory Visit | Attending: Otolaryngology | Admitting: Otolaryngology

## 2021-10-07 DIAGNOSIS — E041 Nontoxic single thyroid nodule: Secondary | ICD-10-CM

## 2021-10-12 ENCOUNTER — Ambulatory Visit (AMBULATORY_SURGERY_CENTER): Payer: BC Managed Care – PPO | Admitting: *Deleted

## 2021-10-12 ENCOUNTER — Other Ambulatory Visit: Payer: Self-pay

## 2021-10-12 VITALS — Ht 64.0 in | Wt 235.0 lb

## 2021-10-12 DIAGNOSIS — Z1211 Encounter for screening for malignant neoplasm of colon: Secondary | ICD-10-CM

## 2021-10-12 MED ORDER — NA SULFATE-K SULFATE-MG SULF 17.5-3.13-1.6 GM/177ML PO SOLN
1.0000 | Freq: Once | ORAL | 0 refills | Status: AC
Start: 2021-10-12 — End: 2021-10-12

## 2021-10-12 NOTE — Progress Notes (Signed)

## 2021-10-13 ENCOUNTER — Other Ambulatory Visit: Payer: Self-pay | Admitting: Otolaryngology

## 2021-10-13 DIAGNOSIS — E041 Nontoxic single thyroid nodule: Secondary | ICD-10-CM

## 2021-10-22 ENCOUNTER — Ambulatory Visit: Payer: BC Managed Care – PPO

## 2021-10-22 ENCOUNTER — Other Ambulatory Visit: Payer: BC Managed Care – PPO

## 2021-10-22 ENCOUNTER — Other Ambulatory Visit: Payer: Self-pay

## 2021-10-22 DIAGNOSIS — M21619 Bunion of unspecified foot: Secondary | ICD-10-CM

## 2021-10-22 DIAGNOSIS — Q666 Other congenital valgus deformities of feet: Secondary | ICD-10-CM

## 2021-10-23 NOTE — Progress Notes (Signed)
SITUATION: Reason for Visit: Fitting and Delivery of Custom Fabricated Foot Orthoses Patient Report: Patient reports comfort and is satisfied with device.  OBJECTIVE DATA: Patient History / Diagnosis:     ICD-10-CM   1. Bunion  M21.619     2. Pes planovalgus  Q66.6       Provided Device:  Custom Functional Foot Orthotics  GOAL OF ORTHOSIS - Improve gait - Decrease energy expenditure - Improve Balance - Provide Triplanar stability of foot complex - Facilitate motion  ACTIONS PERFORMED Patient was fit with foot orthotics trimmed to shoe last. Patient tolerated fittign procedure.   Patient was provided with verbal and written instruction and demonstration regarding donning, doffing, wear, care, proper fit, function, purpose, cleaning, and use of the orthosis and in all related precautions and risks and benefits regarding the orthosis.  Patient was also provided with verbal instruction regarding how to report any failures or malfunctions of the orthosis and necessary follow up care. Patient was also instructed to contact our office regarding any change in status that may affect the function of the orthosis.  Patient demonstrated independence with proper donning, doffing, and fit and verbalized understanding of all instructions.  PLAN: Patient is to follow up in one week or as necessary (PRN). All questions were answered and concerns addressed. Plan of care was discussed with and agreed upon by the patient.

## 2021-10-26 ENCOUNTER — Encounter: Payer: Self-pay | Admitting: Internal Medicine

## 2021-10-26 ENCOUNTER — Ambulatory Visit (AMBULATORY_SURGERY_CENTER): Payer: BC Managed Care – PPO | Admitting: Internal Medicine

## 2021-10-26 ENCOUNTER — Other Ambulatory Visit: Payer: Self-pay

## 2021-10-26 VITALS — BP 106/55 | HR 78 | Temp 97.1°F | Resp 21 | Ht 64.0 in | Wt 235.0 lb

## 2021-10-26 DIAGNOSIS — D123 Benign neoplasm of transverse colon: Secondary | ICD-10-CM | POA: Diagnosis not present

## 2021-10-26 DIAGNOSIS — Z1211 Encounter for screening for malignant neoplasm of colon: Secondary | ICD-10-CM

## 2021-10-26 MED ORDER — SODIUM CHLORIDE 0.9 % IV SOLN: 500.0000 mL | Freq: Once | INTRAVENOUS | Status: DC

## 2021-10-26 NOTE — Progress Notes (Signed)
PT taken to PACU. Monitors in place. VSS. Report given to RN. 

## 2021-10-26 NOTE — Progress Notes (Signed)
Called to room to assist during endoscopic procedure.  Patient ID and intended procedure confirmed with present staff. Received instructions for my participation in the procedure from the performing physician.  

## 2021-10-26 NOTE — Patient Instructions (Addendum)
Read all of the handouts given to you by your recovery room nurse.  Schedule a sleep apnea test through your PCP.   YOU HAD AN ENDOSCOPIC PROCEDURE TODAY AT [facility]:   Refer to the procedure report that was given to you for any specific questions about what was found during the examination.  If the procedure report does not answer your questions, please call your gastroenterologist to clarify.  If you requested that your care partner not be given the details of your procedure findings, then the procedure report has been included in a sealed envelope for you to review at your convenience later.  YOU SHOULD EXPECT: Some feelings of bloating in the abdomen. Passage of more gas than usual.  Walking can help get rid of the air that was put into your GI tract during the procedure and reduce the bloating. If you had a lower endoscopy (such as a colonoscopy or flexible sigmoidoscopy) you may notice spotting of blood in your stool or on the toilet paper. If you underwent a bowel prep for your procedure, you may not have a normal bowel movement for a few days.  Please Note:  You might notice some irritation and congestion in your nose or some drainage.  This is from the oxygen used during your procedure.  There is no need for concern and it should clear up in a day or so.  SYMPTOMS TO REPORT IMMEDIATELY:  Following lower endoscopy (colonoscopy or flexible sigmoidoscopy):  Excessive amounts of blood in the stool  Significant tenderness or worsening of abdominal pains  Swelling of the abdomen that is new, acute  Fever of 100F or higher   For urgent or emergent issues, a gastroenterologist can be reached at any hour by calling [phone]. Do not use MyChart messaging for urgent concerns.    DIET:  We do recommend a small meal at first, but then you may proceed to your regular diet.  Drink plenty of fluids but you should avoid alcoholic beverages for 24 hours. Try to increase the  fiber in your diet, and drink plenty of water.  ACTIVITY:  You should plan to take it easy for the rest of today and you should NOT DRIVE or use heavy machinery until tomorrow (because of the sedation medicines used during the test).    FOLLOW UP: Our staff will call the number listed on your records 48-72 hours following your procedure to check on you and address any questions or concerns that you may have regarding the information given to you following your procedure. If we do not reach you, we will leave a message.  We will attempt to reach you two times.  During this call, we will ask if you have developed any symptoms of COVID 19. If you develop any symptoms (ie: fever, flu-like symptoms, shortness of breath, cough etc.) before then, please call [phone].  If you test positive for Covid 19 in the 2 weeks post procedure, please call and report this information to [place].    If any biopsies were taken you will be contacted by phone or by letter within the next 1-3 weeks.  Please call us at [phone] if you have not heard about the biopsies in 3 weeks.    SIGNATURES/CONFIDENTIALITY: You and/or your care partner have signed paperwork which will be entered into your electronic medical record.  These signatures attest to the fact that that the information above on your After Visit Summary has been reviewed and  is understood.  Full responsibility of the confidentiality of this discharge information lies with you and/or your care-partner.

## 2021-10-26 NOTE — Progress Notes (Signed)
GASTROENTEROLOGY PROCEDURE H&P NOTE   Primary Care Physician: Emelda Fear, DO    Reason for Procedure:   Colon cancer screening  Plan:    Colonoscopy  Patient is appropriate for endoscopic procedure(s) in the ambulatory ([place]) setting.  The nature of the procedure, as well as the risks, benefits, and alternatives were carefully and thoroughly reviewed with the patient. Ample time for discussion and questions allowed. The patient understood, was satisfied, and agreed to proceed.     HPI: Natasha Briggs is a 51 y.o. female who presents for colonoscopy for colon cancer screening. Denies blood in stools, changes in bowel habits, weight loss. Her grandmother had colon polyps. Denies fam hx of colon cancer.  Past Medical History:  Diagnosis Date   Allergy    SEASONAL   Arthritis    LOWER BACK AND RIGHT KNEE   Depression    Diabetes mellitus without complication (HCC)    GERD (gastroesophageal reflux disease)    HPV (human papilloma virus) anogenital infection    Hypertension    Seasonal allergies    Vitamin D deficiency     Past Surgical History:  Procedure Laterality Date   DILATION AND CURETTAGE OF UTERUS     TUBAL LIGATION      Prior to Admission medications   Medication Sig Start Date End Date Taking? Authorizing Provider  amLODipine (NORVASC) 2.5 MG tablet Take 2.5 mg by mouth daily. 02/25/21  Yes [provider]  aspirin EC 81 MG tablet Take 81 mg by mouth daily. Swallow whole.   Yes [provider]  citalopram (CELEXA) 20 MG tablet citalopram 20 mg tablet   1 tablet twice a day by oral route. 03/07/09  Yes [provider]  loratadine (CLARITIN) 10 MG tablet Claritin 10 mg tablet   1 tablet every day by oral route.   Yes [provider]  Multiple Vitamin (MULTI-VITAMIN DAILY PO) daily.   Yes [provider]  pantoprazole (PROTONIX) 40 MG tablet daily. 03/14/16  Yes [provider]  pravastatin (PRAVACHOL) 10 MG  tablet pravastatin 10 mg tablet   1 tablet every day by oral route. 12/06/18  Yes [provider]  Tirzepatide [facility] [place]) Inject 5 mg into the skin once a week.   Yes [provider]  acetaminophen (TYLENOL) 325 MG tablet Take 2 tablets (650 mg total) by mouth every 6 (six) hours as needed. 06/07/21   Jeanell Sparrow, DO  doxycycline (VIBRAMYCIN) 100 MG capsule Take 1 capsule (100 mg total) by mouth 2 (two) times daily. Patient not taking: Reported on 10/12/2021 06/09/21   Pattricia Boss, MD  glyBURIDE (DIABETA) 5 MG tablet Take 2.5 mg by mouth daily as needed. Patient not taking: Reported on 10/12/2021 04/13/21   [provider]  guaiFENesin (ROBITUSSIN) 100 MG/5ML SOLN Take 5 mLs (100 mg total) by mouth every 4 (four) hours as needed for cough or to loosen phlegm. Patient not taking: Reported on 10/12/2021 06/07/21   Wynona Dove A, DO  naproxen (NAPROSYN) 500 MG tablet Take 1 tablet (500 mg total) by mouth 2 (two) times daily with a meal. Patient not taking: Reported on 10/12/2021 08/29/15   Margarita Mail, PA-C    Current Outpatient Medications  Medication Sig Dispense Refill   amLODipine (NORVASC) 2.5 MG tablet Take 2.5 mg by mouth daily.     aspirin EC 81 MG tablet Take 81 mg by mouth daily. Swallow whole.     citalopram (CELEXA) 20 MG tablet citalopram 20 mg  tablet   1 tablet twice a day by oral route.     loratadine (CLARITIN) 10 MG tablet Claritin 10 mg tablet   1 tablet every day by oral route.     Multiple Vitamin (MULTI-VITAMIN DAILY PO) daily.     pantoprazole (PROTONIX) 40 MG tablet daily.     pravastatin (PRAVACHOL) 10 MG tablet pravastatin 10 mg tablet   1 tablet every day by oral route.     Tirzepatide (MOUNJARO [place]) Inject 5 mg into the skin once a week.     acetaminophen (TYLENOL) 325 MG tablet Take 2 tablets (650 mg total) by mouth every 6 (six) hours as needed. 36 tablet 0   doxycycline (VIBRAMYCIN) 100 MG capsule Take 1 capsule (100 mg total) by mouth  2 (two) times daily. (Patient not taking: Reported on 10/12/2021) 20 capsule 0   glyBURIDE (DIABETA) 5 MG tablet Take 2.5 mg by mouth daily as needed. (Patient not taking: Reported on 10/12/2021)     guaiFENesin (ROBITUSSIN) 100 MG/5ML SOLN Take 5 mLs (100 mg total) by mouth every 4 (four) hours as needed for cough or to loosen phlegm. (Patient not taking: Reported on 10/12/2021) 236 mL 0   naproxen (NAPROSYN) 500 MG tablet Take 1 tablet (500 mg total) by mouth 2 (two) times daily with a meal. (Patient not taking: Reported on 10/12/2021) 30 tablet 0   Current Facility-Administered Medications  Medication Dose Route Frequency Provider Last Rate Last Admin   0.9 %  sodium chloride infusion  500 mL Intravenous Once Sharyn Creamer, MD        Allergies as of 10/26/2021   (No Known Allergies)    Family History  Problem Relation Age of Onset   Colon polyps Paternal Grandmother    Colon cancer Neg Hx    Esophageal cancer Neg Hx    Rectal cancer Neg Hx    Stomach cancer Neg Hx     Social History   Socioeconomic History   Marital status: Married    Spouse name: Not on file   Number of children: Not on file   Years of education: Not on file   Highest education level: Not on file  Occupational History   Not on file  Tobacco Use   Smoking status: Never   Smokeless tobacco: Never  Vaping Use   Vaping Use: Never used  Substance and Sexual Activity   Alcohol use: Yes    Comment: OCC   Drug use: No   Sexual activity: Not on file  Other Topics Concern   Not on file  Social History Narrative   Not on file   Social Determinants of Health   Financial Resource Strain: Not on file  Food Insecurity: Not on file  Transportation Needs: Not on file  Physical Activity: Not on file  Stress: Not on file  Social Connections: Not on file  Intimate Partner Violence: Not on file    Physical Exam: Vital signs in last 24 hours: BP 118/77    Pulse 78    Temp (!) 97.1 F (36.2 C) (Temporal)    Ht  5\' 4"  (1.626 m)    Wt 235 lb (106.6 kg)    SpO2 97%    BMI 40.34 kg/m  GEN: NAD EYE: Sclerae anicteric ENT: MMM CV: Non-tachycardic Pulm: No increased work of breathing GI: Soft, NT/ND NEURO:  Alert & Oriented   Christia Reading, MD [facility]  10/26/2021 11:38 AM

## 2021-10-26 NOTE — Op Note (Signed)
[facility] Patient Name: Natasha Briggs Procedure Date: 10/26/2021 11:58 AM MRN: [id number] Endoscopist: Sonny Masters "Natasha Briggs ,  Age: 51 Referring MD:  Date of Birth: 10-18-1970 Gender: Female Account #: [id number] Procedure:                Colonoscopy Indications:              Screening for colorectal malignant neoplasm, This                            is the patient's first colonoscopy Medicines:                Monitored Anesthesia Care Procedure:                Pre-Anesthesia Assessment:                           - Prior to the procedure, a History and Physical                            was performed, and patient medications and                            allergies were reviewed. The patient's tolerance of                            previous anesthesia was also reviewed. The risks                            and benefits of the procedure and the sedation                            options and risks were discussed with the patient.                            All questions were answered, and informed consent                            was obtained. Prior Anticoagulants: The patient has                            taken no previous anticoagulant or antiplatelet                            agents. ASA Grade Assessment: II - A patient with                            mild systemic disease. After reviewing the risks                            and benefits, the patient was deemed in                            satisfactory condition to undergo the procedure.  After obtaining informed consent, the colonoscope                            was passed under direct vision. Throughout the                            procedure, the patient's blood pressure, pulse, and                            oxygen saturations were monitored continuously. The                            Olympus CF-HQ190L [id number]) Colonoscope was                            introduced through the  anus and advanced to the the                            terminal ileum. The colonoscopy was performed                            without difficulty. The patient tolerated the                            procedure well. The quality of the bowel                            preparation was adequate. The terminal ileum,                            ileocecal valve, appendiceal orifice, and rectum                            were photographed. Scope In: 12:15:43 PM Scope Out: 12:32:15 PM Scope Withdrawal Time: 0 hours 11 minutes 8 seconds  Total Procedure Duration: 0 hours 16 minutes 32 seconds  Findings:                 The terminal ileum appeared normal.                           A 6 mm polyp was found in the transverse colon. The                            polyp was sessile. The polyp was removed with a                            cold snare. Resection and retrieval were complete.                           A few diverticula were found in the sigmoid colon.                           Non-bleeding internal hemorrhoids were found during  retroflexion. Complications:            No immediate complications. Estimated Blood Loss:     Estimated blood loss was minimal. Impression:               - The examined portion of the ileum was normal.                           - One 6 mm polyp in the transverse colon, removed                            with a cold snare. Resected and retrieved.                           - Diverticulosis in the sigmoid colon.                           - Non-bleeding internal hemorrhoids. Recommendation:           - Discharge patient to home (with escort).                           - Await pathology results.                           - The findings and recommendations were discussed                            with the patient. [address]ia Briggs,  10/26/2021 12:35:24 PM

## 2021-10-27 ENCOUNTER — Other Ambulatory Visit (HOSPITAL_COMMUNITY)
Admission: RE | Admit: 2021-10-27 | Discharge: 2021-10-27 | Disposition: A | Discharge status: Home / Self Care | Payer: BC Managed Care – PPO | Source: Ambulatory Visit | Attending: Otolaryngology | Admitting: Otolaryngology

## 2021-10-27 ENCOUNTER — Ambulatory Visit
Admission: RE | Admit: 2021-10-27 | Discharge: 2021-10-27 | Disposition: A | Discharge status: Home / Self Care | Payer: BC Managed Care – PPO | Source: Ambulatory Visit | Attending: Otolaryngology | Admitting: Otolaryngology

## 2021-10-27 DIAGNOSIS — E041 Nontoxic single thyroid nodule: Secondary | ICD-10-CM | POA: Insufficient documentation

## 2021-10-28 ENCOUNTER — Encounter: Payer: Self-pay | Admitting: Internal Medicine

## 2021-10-28 ENCOUNTER — Telehealth: Payer: Self-pay

## 2021-10-28 LAB — CYTOLOGY - NON PAP

## 2021-10-28 NOTE — Telephone Encounter (Signed)
First attempt follow up call to pt, no answer. 

## 2021-10-28 NOTE — Telephone Encounter (Signed)
Attempted to reach pt. With follow-up call following endoscopic procedure 10/26/2021.  LM on pt. Voice mail to call if she has any questions or concerns.

## 2022-04-26 ENCOUNTER — Ambulatory Visit
Admission: RE | Admit: 2022-04-26 | Discharge: 2022-04-26 | Disposition: A | Discharge status: Home / Self Care | Payer: BC Managed Care – PPO | Source: Ambulatory Visit | Attending: Family | Admitting: Family

## 2022-04-26 ENCOUNTER — Other Ambulatory Visit: Payer: Self-pay | Admitting: Family

## 2022-04-26 DIAGNOSIS — M25512 Pain in left shoulder: Secondary | ICD-10-CM

## 2022-12-14 ENCOUNTER — Encounter (HOSPITAL_BASED_OUTPATIENT_CLINIC_OR_DEPARTMENT_OTHER): Payer: Self-pay | Admitting: *Deleted

## 2022-12-14 ENCOUNTER — Emergency Department (HOSPITAL_BASED_OUTPATIENT_CLINIC_OR_DEPARTMENT_OTHER): Payer: BC Managed Care – PPO | Admitting: Radiology

## 2022-12-14 ENCOUNTER — Emergency Department (HOSPITAL_BASED_OUTPATIENT_CLINIC_OR_DEPARTMENT_OTHER)
Admission: EM | Admit: 2022-12-14 | Discharge: 2022-12-14 | Disposition: A | Discharge status: Home / Self Care | Payer: BC Managed Care – PPO | Attending: Emergency Medicine | Admitting: Emergency Medicine

## 2022-12-14 ENCOUNTER — Other Ambulatory Visit: Payer: Self-pay

## 2022-12-14 DIAGNOSIS — Z23 Encounter for immunization: Secondary | ICD-10-CM | POA: Insufficient documentation

## 2022-12-14 DIAGNOSIS — E119 Type 2 diabetes mellitus without complications: Secondary | ICD-10-CM | POA: Diagnosis not present

## 2022-12-14 DIAGNOSIS — W01198A Fall on same level from slipping, tripping and stumbling with subsequent striking against other object, initial encounter: Secondary | ICD-10-CM | POA: Insufficient documentation

## 2022-12-14 DIAGNOSIS — M25562 Pain in left knee: Secondary | ICD-10-CM | POA: Insufficient documentation

## 2022-12-14 DIAGNOSIS — Z79899 Other long term (current) drug therapy: Secondary | ICD-10-CM | POA: Insufficient documentation

## 2022-12-14 DIAGNOSIS — I10 Essential (primary) hypertension: Secondary | ICD-10-CM | POA: Insufficient documentation

## 2022-12-14 DIAGNOSIS — S0990XA Unspecified injury of head, initial encounter: Secondary | ICD-10-CM

## 2022-12-14 DIAGNOSIS — M25561 Pain in right knee: Secondary | ICD-10-CM | POA: Diagnosis not present

## 2022-12-14 DIAGNOSIS — Z7982 Long term (current) use of aspirin: Secondary | ICD-10-CM | POA: Diagnosis not present

## 2022-12-14 DIAGNOSIS — S0993XA Unspecified injury of face, initial encounter: Secondary | ICD-10-CM

## 2022-12-14 DIAGNOSIS — S01511A Laceration without foreign body of lip, initial encounter: Secondary | ICD-10-CM | POA: Insufficient documentation

## 2022-12-14 DIAGNOSIS — W19XXXA Unspecified fall, initial encounter: Secondary | ICD-10-CM

## 2022-12-14 MED ORDER — ACETAMINOPHEN 325 MG PO TABS: 650.0000 mg | ORAL_TABLET | Freq: Four times a day (QID) | ORAL | 0 refills | Status: AC | PRN

## 2022-12-14 MED ORDER — IBUPROFEN 600 MG PO TABS: 600.0000 mg | ORAL_TABLET | Freq: Four times a day (QID) | ORAL | 0 refills | Status: DC | PRN

## 2022-12-14 MED ORDER — TETANUS-DIPHTH-ACELL PERTUSSIS 5-2.5-18.5 LF-MCG/0.5 IM SUSY
0.5000 mL | PREFILLED_SYRINGE | Freq: Once | INTRAMUSCULAR | Status: AC
  Administered 2022-12-14: 0.5 mL via INTRAMUSCULAR
  Filled 2022-12-14: qty 0.5

## 2022-12-14 NOTE — Discharge Instructions (Addendum)
It was a pleasure caring for you today in the emergency department.  Please return to the emergency department for any worsening or worrisome symptoms.    Based on the events which brought you to the ER today, it is possible that you may have a concussion. A concussion occurs when there is a blow to the head or body, with enough force to shake the brain and disrupt how the brain functions. You may experience symptoms such as headaches, sensitivity to light/noise, dizziness, cognitive slowing, difficulty concentrating / remembering, trouble sleeping and drowsiness. These symptoms may last anywhere from hours/days to potentially weeks/months. While these symptoms are very frustrating and perhaps debilitating, it is important that you remember that they will improve over time. Everyone has a different rate of recovery; it is difficult to predict when your symptoms will resolve. In order to allow for your brain to heal after the injury, we recommend that you see your primary physician or a physician knowledgeable in concussion management. We also advise you to let your body and brain rest: avoid physical activities (sports, gym, and exercise) and reduce cognitive demands (reading, texting, TV watching, computer use, video games, etc). School attendance, after-school activities and work may need to be modified to avoid increasing symptoms. We recommend against driving until until all symptoms have resolved. Come back to the ER right away if you are having repeated episodes of vomiting, severe/worsening headache/dizziness or any other symptom that alarms you. We recommended that someone stay with you for the next 24 hours to monitor for these worrisome symptoms. 

## 2022-12-14 NOTE — ED Triage Notes (Signed)
Pt is here after trip and fall.  Pt has laceration to the inside of her lip, denies any loose teeth.  Tetanus unknown.  Pt denies any LOC with fall.  Pt is not on blood thinners.  Pt also has pain in both knees.

## 2022-12-14 NOTE — ED Provider Notes (Addendum)
[facility] Provider Note  CSN: [id number] Arrival date & time: 12/14/22 1542  Chief Complaint(s) Fall  HPI Natasha Briggs is a 52 y.o. female with past medical history as below, significant for GERD, hypertension, seasonal allergies, arthritis who presents to the ED with complaint of fall.  Reports just prior to arrival she was at work, tripped over an object on the floor fell to the ground.  Fell to her knees and hit her face on the ground.  No LOC.  No blood thinners, no headache.  Was able to ambulate afterwards.  No numbness or tingling, no vomiting.  No significant pain to her neck.  Did notice wound to left lip.  Bleeding well-controlled on arrival.  Noted pain to bilateral knees.  Unsure last tetanus shot.  No wound care prior to arrival.  Denies sensation of loose teeth.  No nosebleed.  No vision changes.  No hearing changes.  Past Medical History Past Medical History:  Diagnosis Date   Allergy    SEASONAL   Arthritis    LOWER BACK AND RIGHT KNEE   Depression    Diabetes mellitus without complication (HCC)    GERD (gastroesophageal reflux disease)    HPV (human papilloma virus) anogenital infection    Hypertension    Seasonal allergies    Vitamin D deficiency    There are no problems to display for this patient.  Home Medication(s) Prior to Admission medications   Medication Sig Start Date End Date Taking? Authorizing Provider  acetaminophen (TYLENOL) 325 MG tablet Take 2 tablets (650 mg total) by mouth every 6 (six) hours as needed. 06/07/21   Jeanell Sparrow, DO  amLODipine (NORVASC) 2.5 MG tablet Take 2.5 mg by mouth daily. 02/25/21   [provider]  aspirin EC 81 MG tablet Take 81 mg by mouth daily. Swallow whole.    [provider]  citalopram (CELEXA) 20 MG tablet citalopram 20 mg tablet   1 tablet twice a day by oral route. 03/07/09   [provider]  doxycycline (VIBRAMYCIN) 100 MG capsule Take 1  capsule (100 mg total) by mouth 2 (two) times daily. Patient not taking: Reported on 10/12/2021 06/09/21   Pattricia Boss, MD  glyBURIDE (DIABETA) 5 MG tablet Take 2.5 mg by mouth daily as needed. Patient not taking: Reported on 10/12/2021 04/13/21   [provider]  guaiFENesin (ROBITUSSIN) 100 MG/5ML SOLN Take 5 mLs (100 mg total) by mouth every 4 (four) hours as needed for cough or to loosen phlegm. Patient not taking: Reported on 10/12/2021 06/07/21   Wynona Dove A, DO  loratadine (CLARITIN) 10 MG tablet Claritin 10 mg tablet   1 tablet every day by oral route.    [provider]  Multiple Vitamin (MULTI-VITAMIN DAILY PO) daily.    [provider]  naproxen (NAPROSYN) 500 MG tablet Take 1 tablet (500 mg total) by mouth 2 (two) times daily with a meal. Patient not taking: Reported on 10/12/2021 08/29/15   Margarita Mail, PA-C  pantoprazole (PROTONIX) 40 MG tablet daily. 03/14/16   [provider]  pravastatin (PRAVACHOL) 10 MG tablet pravastatin 10 mg tablet   1 tablet every day by oral route. 12/06/18   [provider]  Tirzepatide [facility]) Inject 5 mg into the skin once a week.    [provider]  Past Surgical History Past Surgical History:  Procedure Laterality Date   DILATION AND CURETTAGE OF UTERUS     TUBAL LIGATION     Family History Family History  Problem Relation Age of Onset   Colon polyps Paternal Grandmother    Colon cancer Neg Hx    Esophageal cancer Neg Hx    Rectal cancer Neg Hx    Stomach cancer Neg Hx     Social History Social History   Tobacco Use   Smoking status: Never   Smokeless tobacco: Never  Vaping Use   Vaping Use: Never used  Substance Use Topics   Alcohol use: Yes    Comment: OCC   Drug use: No   Allergies Patient has no known allergies.  Review of  Systems Review of Systems  Constitutional:  Negative for activity change and fever.  HENT:  Positive for facial swelling. Negative for trouble swallowing.   Eyes:  Negative for discharge and redness.  Respiratory:  Negative for cough and shortness of breath.   Cardiovascular:  Negative for chest pain and palpitations.  Gastrointestinal:  Negative for abdominal pain and nausea.  Genitourinary:  Negative for dysuria and flank pain.  Musculoskeletal:  Positive for arthralgias. Negative for back pain and gait problem.  Skin:  Negative for pallor and rash.  Neurological:  Negative for syncope and headaches.    Physical Exam Vital Signs  I have reviewed the triage vital signs BP 126/79 (BP Location: Right Arm)   Pulse 71   Temp 98 F (36.7 C)   Resp 16   SpO2 95%  Physical Exam Vitals and nursing note reviewed.  Constitutional:      General: She is not in acute distress.    Appearance: Normal appearance. She is not ill-appearing.  HENT:     Head: Normocephalic. Abrasion present. No raccoon eyes, Battle's sign, right periorbital erythema or left periorbital erythema.     Jaw: There is normal jaw occlusion. No trismus or malocclusion.      Comments: Swollen left upper lip, abrasion noted externally, superficial laceration noted internally upper lip left    Right Ear: External ear normal.     Left Ear: External ear normal.     Nose: Nose normal.     Mouth/Throat:     Mouth: Mucous membranes are moist.     Comments: Dentition appears normal Super facial laceration anterior aspect upper lip. Patient is able to bite down on tongue depressor and twist/break a tongue depressor without significant discomfort to either side of her mandible. Eyes:     General: No scleral icterus.       Right eye: No discharge.        Left eye: No discharge.     Extraocular Movements: Extraocular movements intact.     Pupils: Pupils are equal, round, and reactive to light.  Cardiovascular:     Rate and  Rhythm: Normal rate and regular rhythm.     Pulses: Normal pulses.  Pulmonary:     Effort: Pulmonary effort is normal. No respiratory distress.     Breath sounds: No stridor.  Abdominal:     General: Abdomen is flat.     Palpations: Abdomen is soft.     Tenderness: There is no abdominal tenderness. There is no guarding.  Musculoskeletal:     Cervical back: Normal range of motion. No rigidity.     Right lower leg: No edema.     Left lower leg: No edema.  Comments: Lower extremities NVI Full range of motion bilateral lower extremities.  No significant pain to either knee with provocative testing.  Ligaments intact.  Achilles patellar and quad tendons intact bilateral  Strength symmetric bilateral lower extremities  Pelvis stable to AP pressure.  No pain on palpation acetabular bilateral  No midline spinous process tenderness to palpation or percussion, no crepitus or step-off.     Skin:    General: Skin is warm and dry.     Capillary Refill: Capillary refill takes less than 2 seconds.  Neurological:     Mental Status: She is alert and oriented to person, place, and time.     GCS: GCS eye subscore is 4. GCS verbal subscore is 5. GCS motor subscore is 6.     Cranial Nerves: Cranial nerves 2-12 are intact. No dysarthria.     Sensory: Sensation is intact.     Motor: Motor function is intact. No tremor.     Coordination: Coordination is intact.     Gait: Gait is intact.     Comments: Strength equal bilateral upper and lower extremities 5/5  Psychiatric:        Mood and Affect: Mood normal.        Behavior: Behavior normal.     ED Results and Treatments Labs (all labs ordered are listed, but only abnormal results are displayed) Labs Reviewed - No data to display                                                                                                                        Radiology DG Knee Complete 4 Views Left  Result Date: 12/14/2022 CLINICAL DATA:  Fall.  Knee  pain. EXAM: LEFT KNEE - COMPLETE 4+ VIEW COMPARISON:  None Available. FINDINGS: Mild to moderate medial coronoid joint space narrowing and peripheral degenerative osteophytosis. Mild peripheral lateral current degenerative osteophytosis without significant joint space narrowing. Severe patellofemoral joint space narrowing with moderate superior osteophytosis. No joint effusion. No acute fracture or dislocation. 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 Electronically Signed   By: Yvonne Kendall M.D.   On: 12/14/2022 17:37   DG Knee Complete 4 Views Right  Result Date: 12/14/2022 CLINICAL DATA:  Fall.  Knee pain. EXAM: RIGHT KNEE - COMPLETE 4+ VIEW COMPARISON:  None Available. FINDINGS: Moderate medial coronoid joint space narrowing and peripheral osteophytosis. Severe patellofemoral joint space narrowing with moderate to large superior osteophytes. Minimal peripheral lateral tibial plateau degenerative spurring. Mild chronic enthesopathic change at the quadriceps insertion on the patella. No joint effusion. No acute fracture or dislocation. IMPRESSION: Severe patellofemoral and moderate medial compartment osteoarthritis. Electronically Signed   By: Yvonne Kendall M.D.   On: 12/14/2022 17:35    Pertinent labs & imaging results that were available during my care of the patient were reviewed by me and considered in my medical decision making (see MDM for details).  Medications Ordered in ED Medications  Tdap (BOOSTRIX) injection 0.5 mL (has no administration  in time range)                                                                                                                                     Procedures Procedures  (including critical care time)  Medical Decision Making / ED Course    Medical Decision Making:    Lakhia Bredemeier is a 52 y.o. female with past medical history as below, significant for GERD, hypertension, seasonal allergies,  arthritis who presents to the ED with complaint of fall.. The complaint involves an extensive differential diagnosis and also carries with it a high risk of complications and morbidity.  Serious etiology was considered. Ddx includes but is not limited to: Differential diagnoses for head trauma includes subdural hematoma, epidural hematoma, acute concussion, traumatic subarachnoid hemorrhage, cerebral contusions, etc.    Complete initial physical exam performed, notably the patient  was no acute distress, neuroexam is nonfocal.  Speaking clearly in full sentences.    Reviewed and confirmed nursing documentation for past medical history, family history, social history.  Vital signs reviewed.       Patient presents with low mechanism head trauma. On initial evaluation patient appears in no acute distress, afebrile with normal vital signs. Patient has completely intact neurovascular exam, and pain improved in ED.      no LOC, low mechanism of injury.  Nexus negative, CT head does not appear necessary at this time.  No neck pain, no pain with neck range of motion, neck CT does also not appear indicated.  [place] C-spine rule negative  Nexus head CT rule negative  Patient with apparent mechanical fall with superficial injury to her face.  Low suspicion for mandible fracture, no dental injuries.  She has superficial laceration to her upper lip, does not require suture repair.  Will update tetanus, wound care for lip abrasion  Gait steady, stable for discharge.  Discussed possible etiology of concussion, signs and symptoms, and discharge instructions.   The patient improved significantly and was discharged in stable condition. Detailed discussions were had with the patient regarding current findings, and need for close f/u with PCP or on call doctor. The patient has been instructed to return immediately if the symptoms worsen in any way for re-evaluation. Patient verbalized understanding and is in  agreement with current care plan. All questions answered prior to discharge.    Additional history obtained: -Additional history obtained from spouse -External records from outside source obtained and reviewed including: Chart review including previous notes, labs, imaging, consultation notes including primary care documentation, prior labs and imaging   Lab Tests: na  EKG   EKG Interpretation  Date/Time:    Ventricular Rate:    PR Interval:    QRS Duration:   QT Interval:    QTC Calculation:   R Axis:     Text Interpretation:           Imaging Studies ordered: I  ordered imaging studies including knee x-ray bilateral I independently visualized the following imaging with scope of interpretation limited to determining acute life threatening conditions related to emergency care: Arthritis I independently visualized and interpreted imaging. I agree with the radiologist interpretation   Medicines ordered and prescription drug management: Meds ordered this encounter  Medications   Tdap (BOOSTRIX) injection 0.5 mL    -I have reviewed the patients home medicines and have made adjustments as needed   Consultations Obtained: na   Cardiac Monitoring: na  Social Determinants of Health:  Diagnosis or treatment significantly limited by social determinants of health: na   Reevaluation: After the interventions noted above, I reevaluated the patient and found that they have improved  Co morbidities that complicate the patient evaluation  Past Medical History:  Diagnosis Date   Allergy    SEASONAL   Arthritis    LOWER BACK AND RIGHT KNEE   Depression    Diabetes mellitus without complication (HCC)    GERD (gastroesophageal reflux disease)    HPV (human papilloma virus) anogenital infection    Hypertension    Seasonal allergies    Vitamin D deficiency       Dispostion: Disposition decision including need for hospitalization was considered, and patient  discharged from emergency department.    Final Clinical Impression(s) / ED Diagnoses Final diagnoses:  Facial injury, initial encounter  Fall, initial encounter     This chart was dictated using voice recognition software.  Despite best efforts to proofread,  errors can occur which can change the documentation meaning.    Jeanell Sparrow, DO 12/14/22 2121    Jeanell Sparrow, DO 12/14/22 2123    Jeanell Sparrow, DO 12/14/22 2125

## 2022-12-14 NOTE — ED Notes (Signed)
RN reviewed discharge instructions with pt. Pt verbalized understanding and had no further questions. VSS upon discharge.  

## 2022-12-17 IMAGING — DX DG CHEST 2V
2 series · 2 of 2 positions shown · non-contrast
Comparison: 06/07/2021

CLINICAL DATA: Chest pain

EXAM:
CHEST - 2 VIEW

[chest pa]
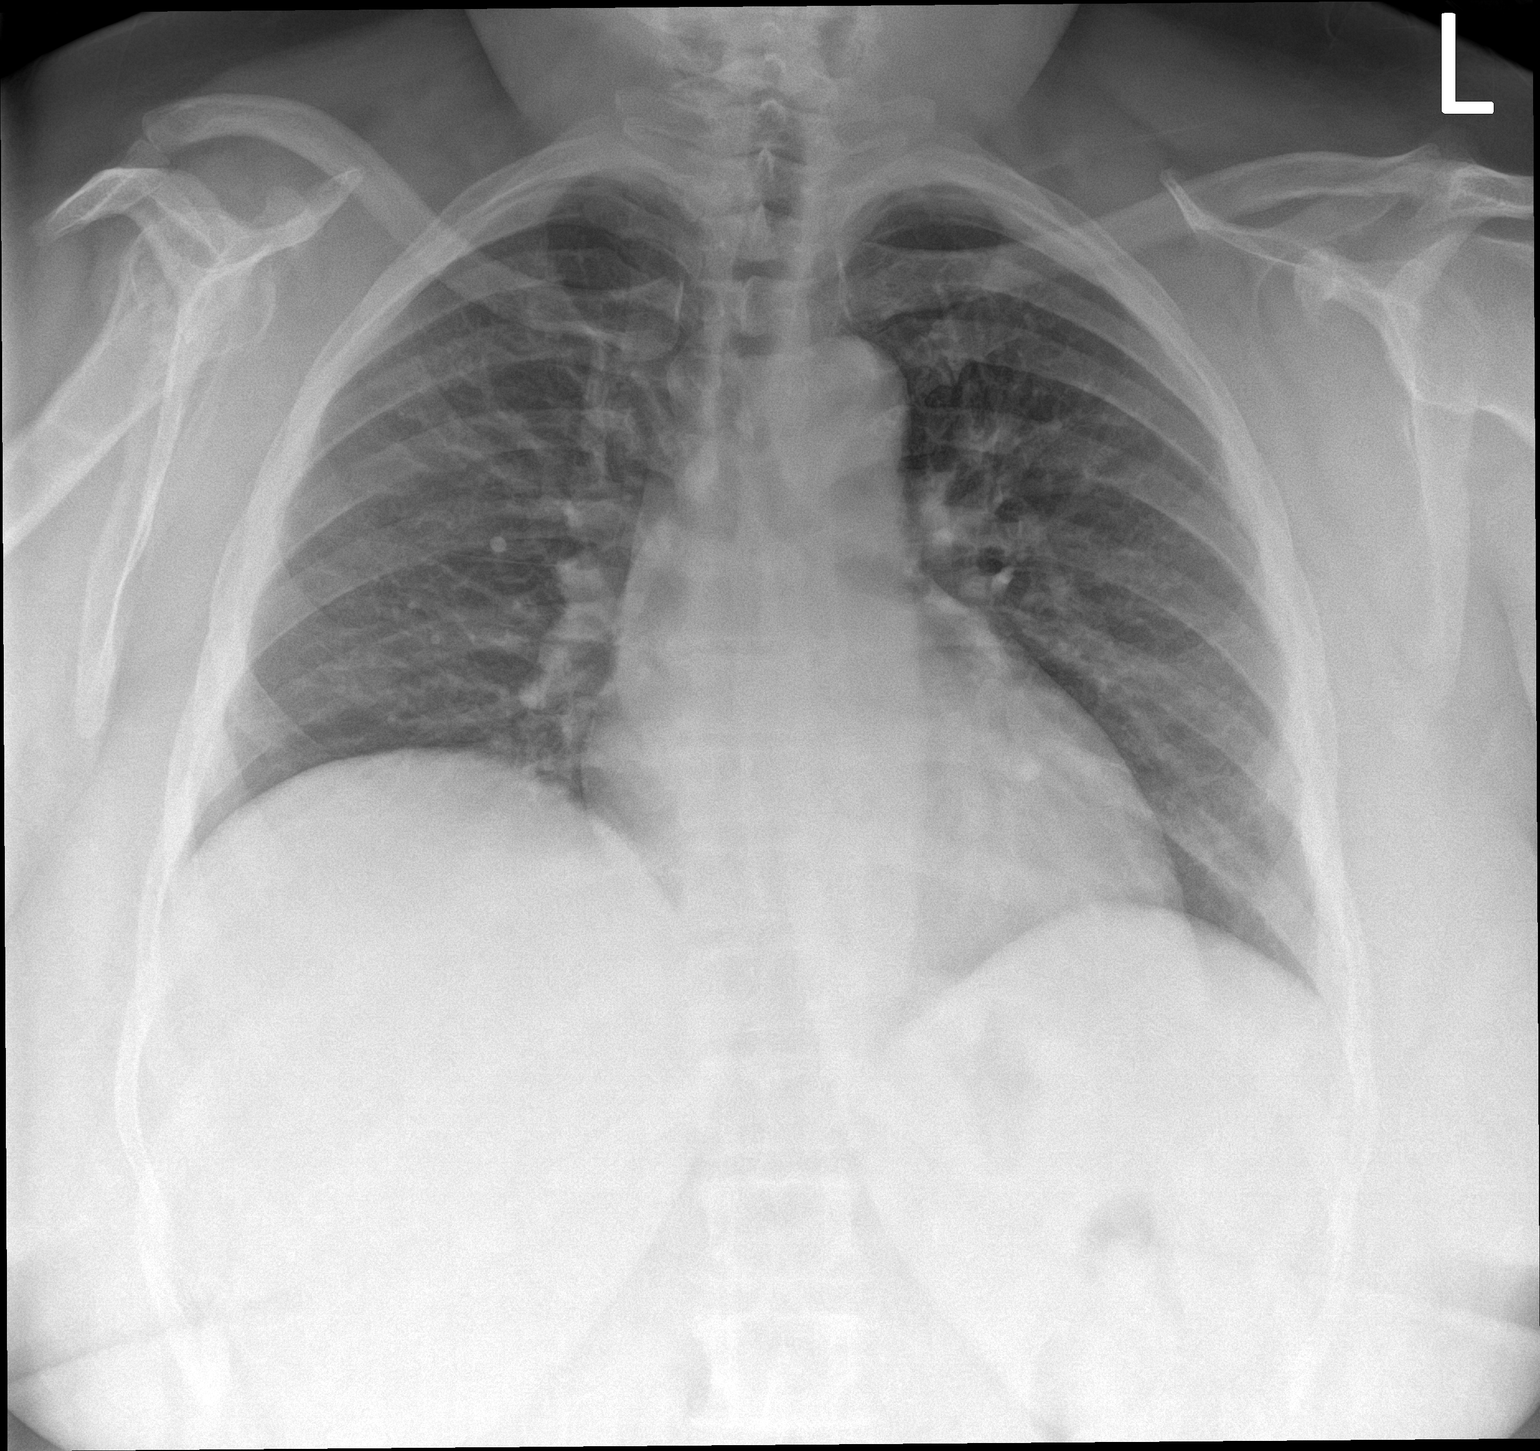

[chest lat]
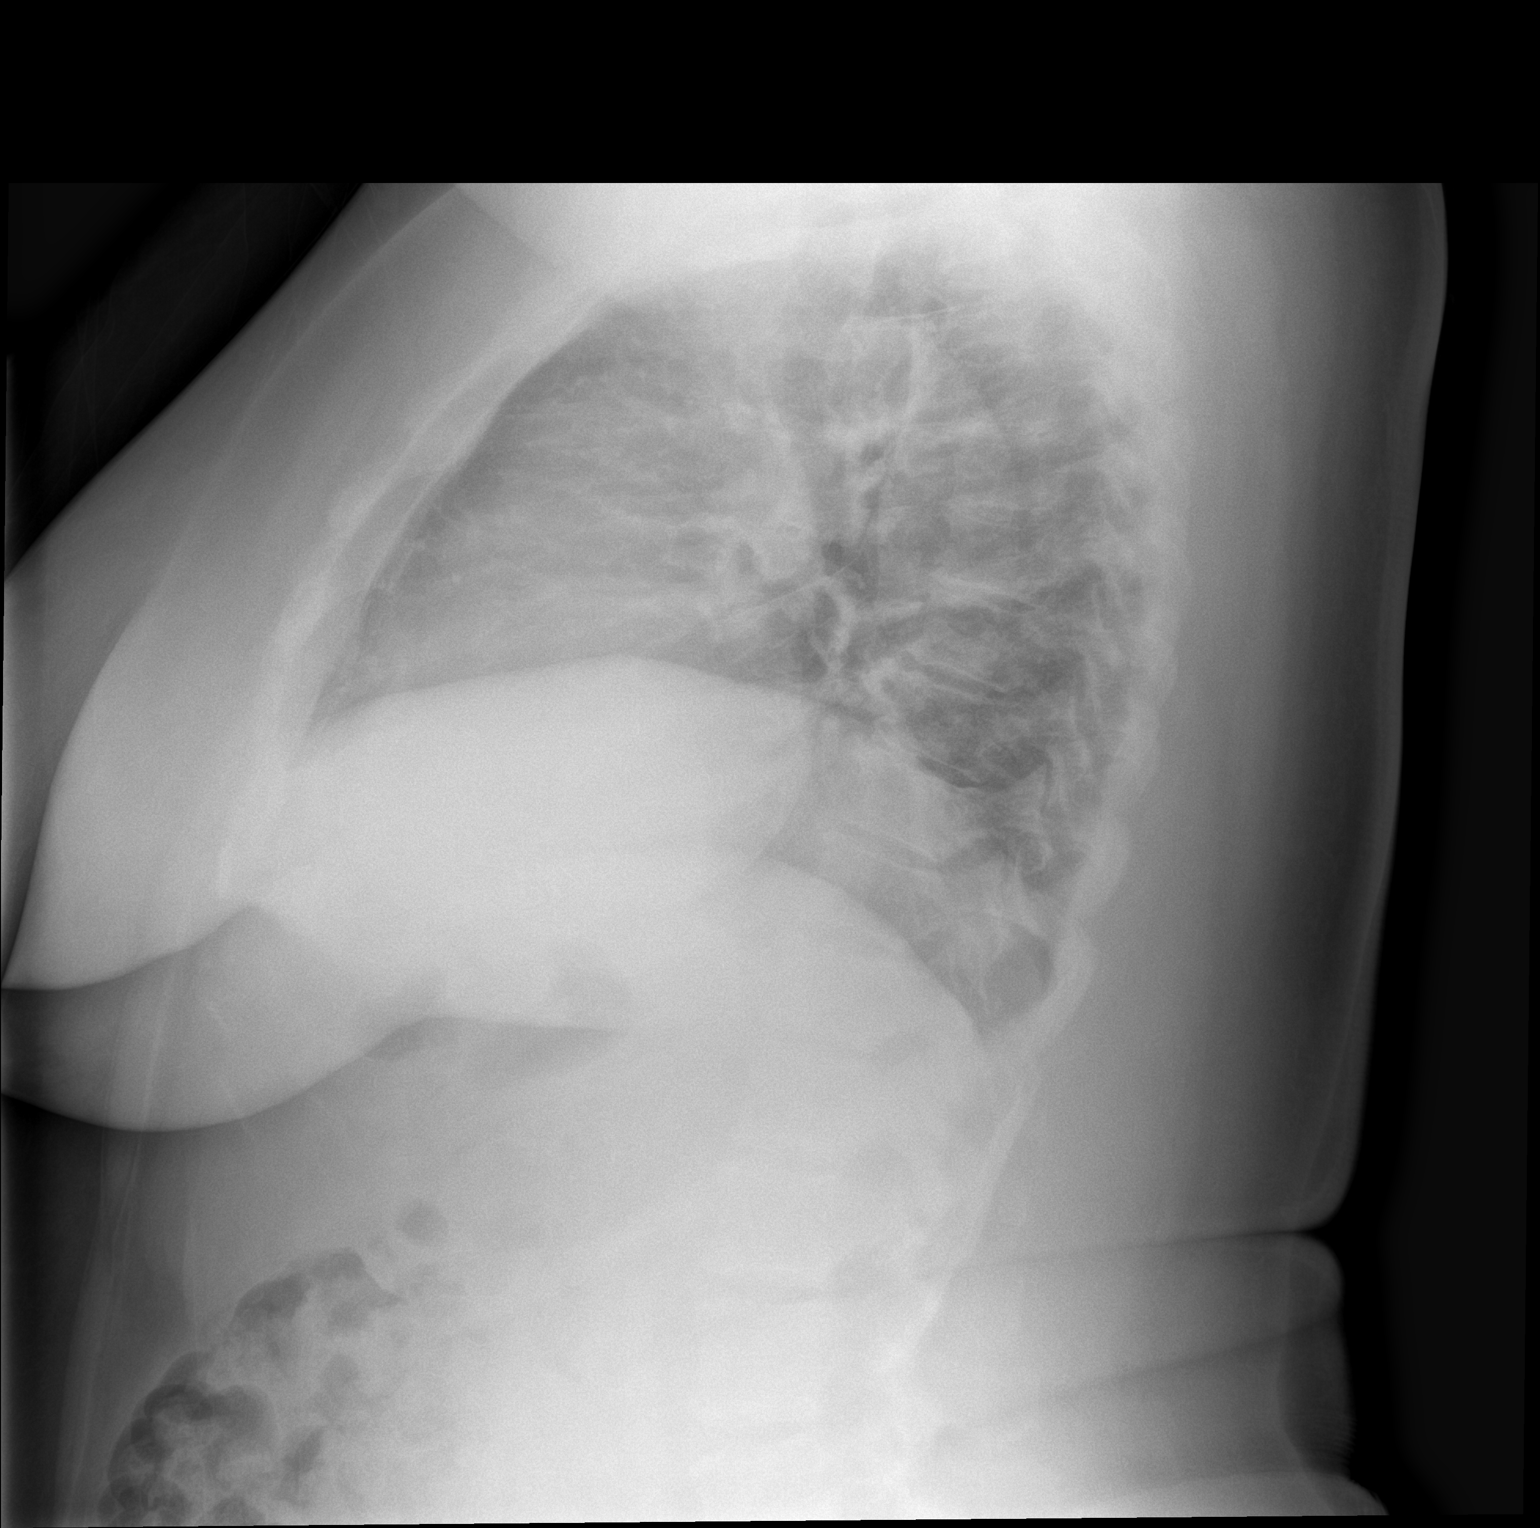

[2 of 2 positions shown; findings below may reference images not displayed]

FINDINGS: Heart and mediastinal contours are within normal limits. No focal
opacities or effusions. No acute bony abnormality.
IMPRESSION: No active cardiopulmonary disease.

## 2022-12-17 IMAGING — CT CT ABD-PELV W/ CM
2 of 15 series · 12 of 46 positions shown, 18 images · IV contrast (omnipaque)
Comparison: None.

CLINICAL DATA: Right and left upper quadrant pain.

EXAM:
CT ABDOMEN AND PELVIS WITH CONTRAST
TECHNIQUE: Multidetector CT imaging of the abdomen and pelvis was performed
using the standard protocol following bolus administration of
intravenous contrast.
CONTRAST:  100mL OMNIPAQUE IOHEXOL 350 MG/ML SOLN

[Series 7: cor soft · coronal · 0.54mm/px · 1 of 149 slices shown, 2 images]
[im 75/149  soft-tissue]
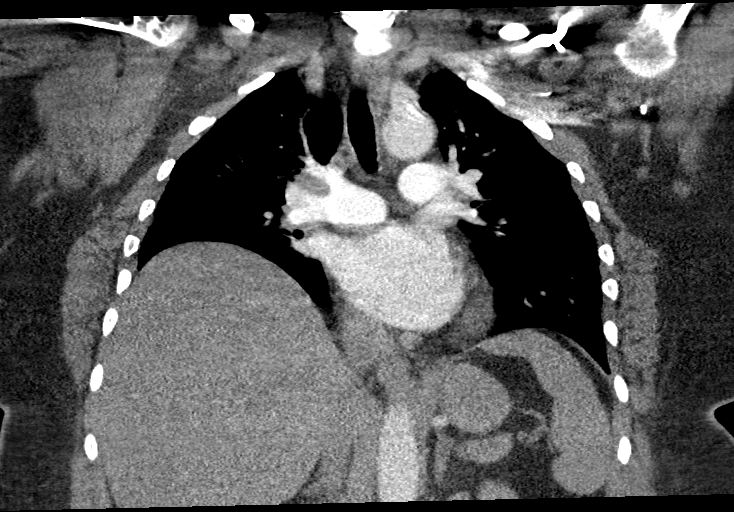
[im 75/149  bone]
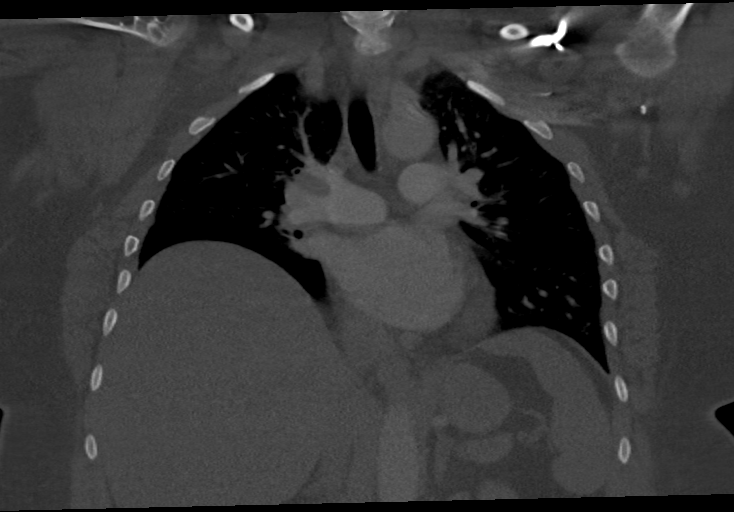

[Series 12: thins · axial · 0.98mm/px · z∈[+965,+1377]mm · 11 of 732 slices shown, 16 images]
[im 49/732  soft-tissue]
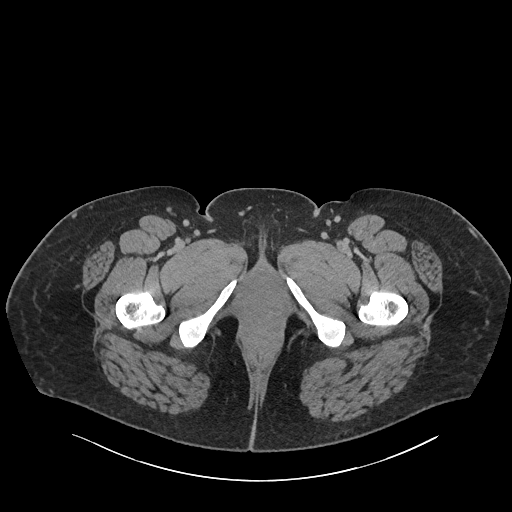
[im 49/732  bone]
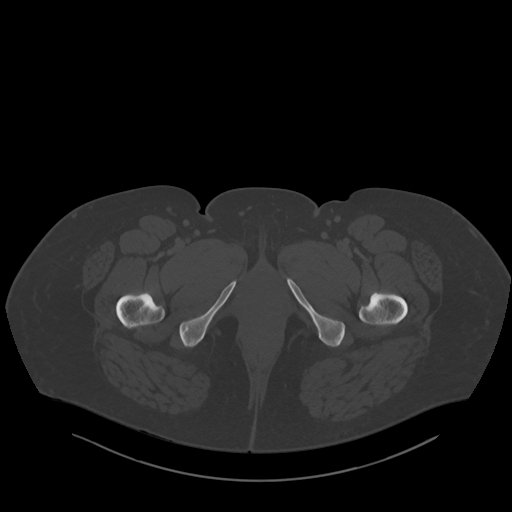
[im 147/732  soft-tissue]
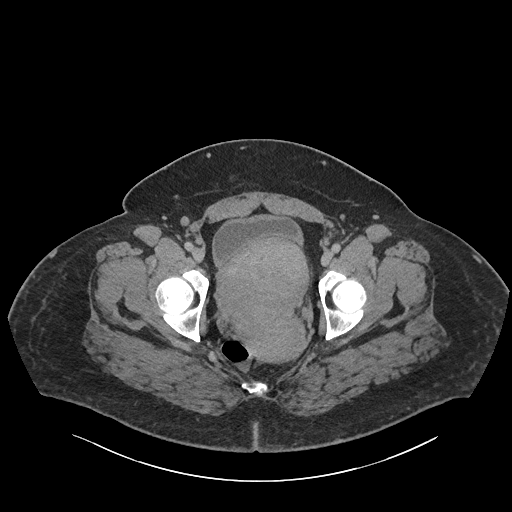
[im 195/732  soft-tissue]
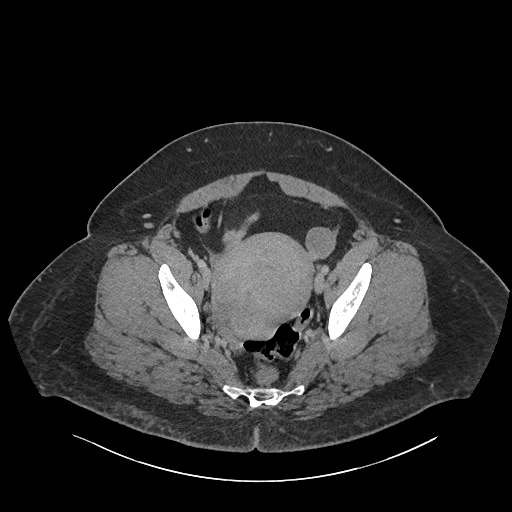
[im 244/732  soft-tissue]
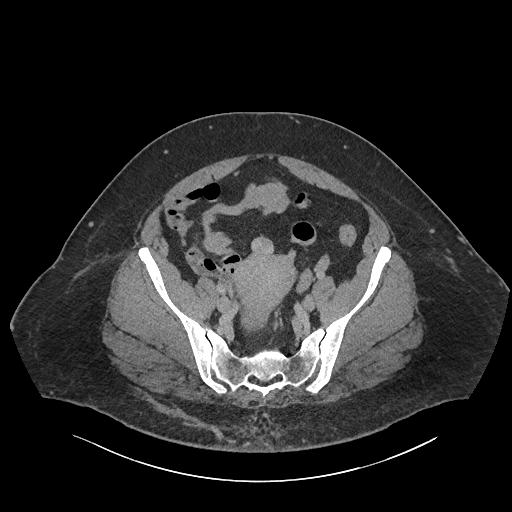
[im 342/732  soft-tissue]
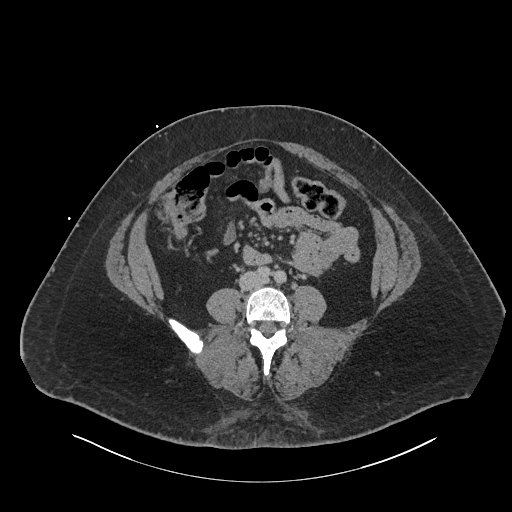
[im 390/732  soft-tissue]
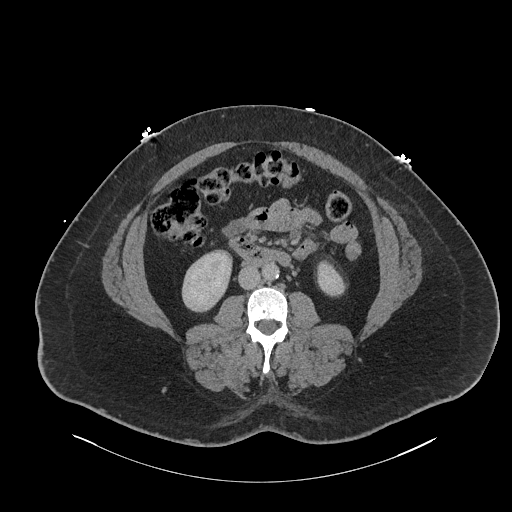
[im 488/732  soft-tissue]
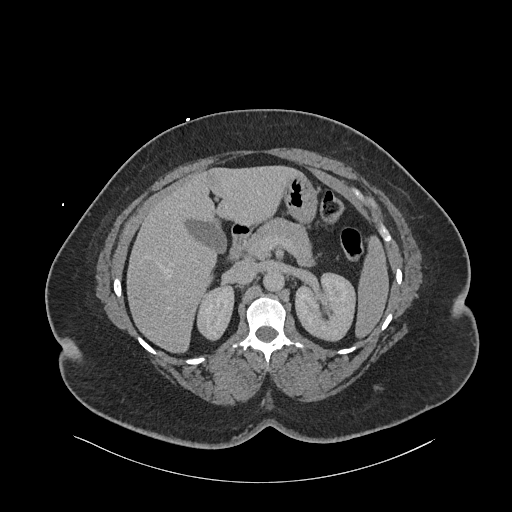
[im 537/732  soft-tissue]
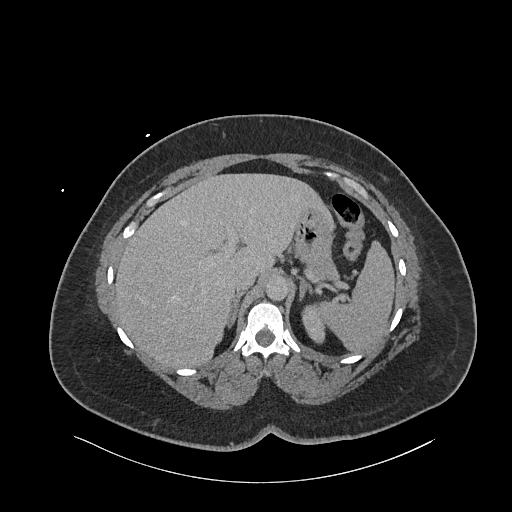
[im 537/732  lung]
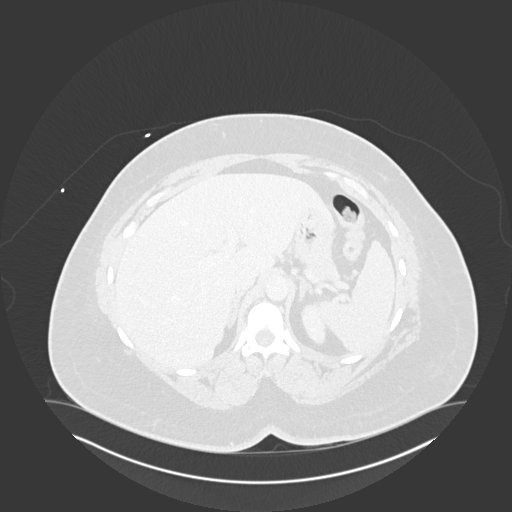
[im 585/732  soft-tissue]
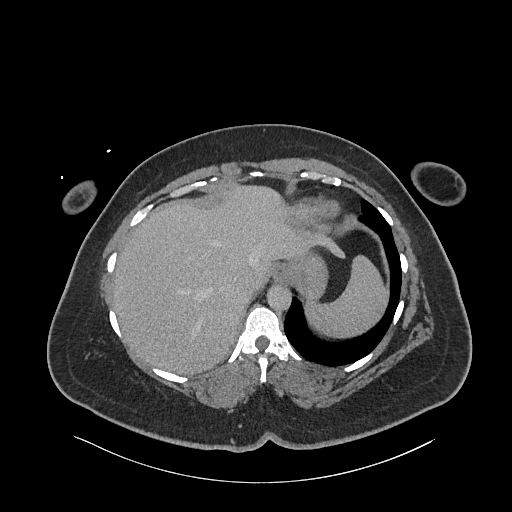
[im 585/732  lung]
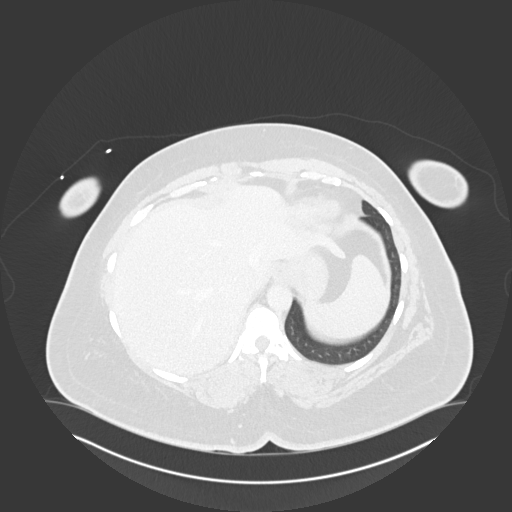
[im 585/732  bone]
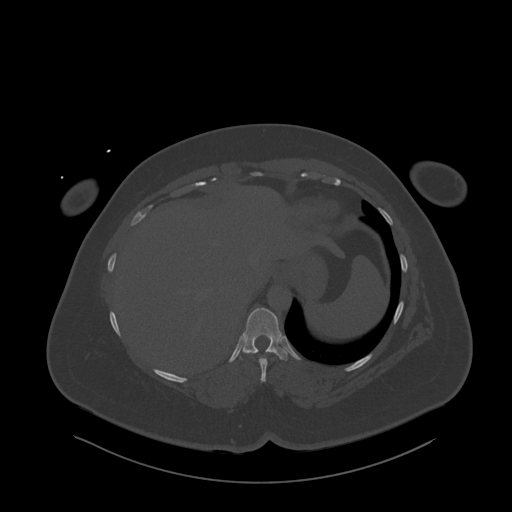
[im 634/732  lung]
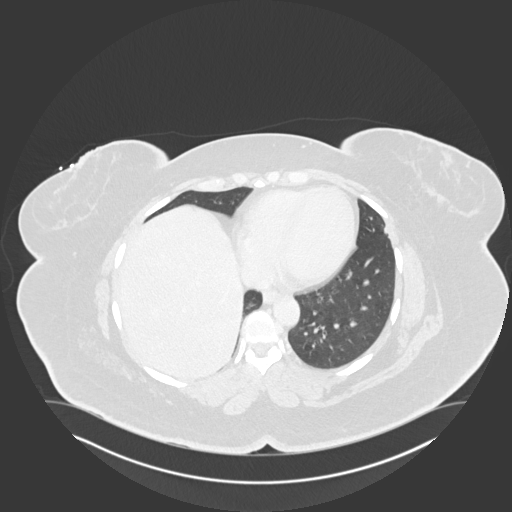
[im 683/732  soft-tissue]
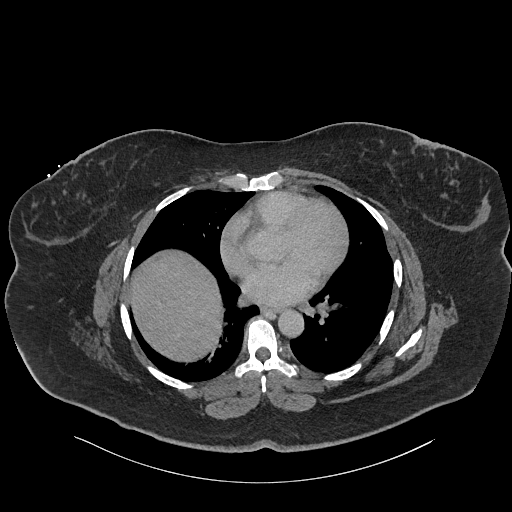
[im 683/732  lung]
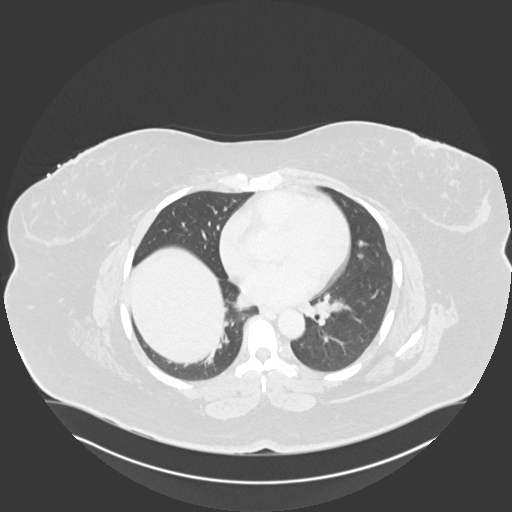

[12 of 46 positions shown; findings below may reference images not displayed]

FINDINGS: Lower chest: Elevation of the right hemidiaphragm. No effusions.
Airway thickening in the lower lobes. No confluent opacities.

Hepatobiliary: No focal hepatic abnormality. Gallbladder
unremarkable.

Pancreas: No focal abnormality or ductal dilatation.

Spleen: No focal abnormality.  Normal size.

Adrenals/Urinary Tract: No adrenal abnormality. No focal renal
abnormality. No stones or hydronephrosis. Urinary bladder is
unremarkable.

Stomach/Bowel: Normal appendix. Stomach, large and small bowel
grossly unremarkable.

Vascular/Lymphatic: No evidence of aneurysm or adenopathy. Scattered
aortic calcifications.

Reproductive: Fibroid uterus. 3 cm cyst in the left ovary. No no
right adnexal mass.

Other: No free fluid or free air.

Musculoskeletal: Degenerative disc and facet disease at L5-S1 with 6
mm anterolisthesis of L5 on S1. No acute bony abnormality.
IMPRESSION: No acute findings in the abdomen or pelvis.

Fibroid uterus.

3 cm left ovarian cyst.

Aortic atherosclerosis.

## 2022-12-31 ENCOUNTER — Emergency Department (HOSPITAL_BASED_OUTPATIENT_CLINIC_OR_DEPARTMENT_OTHER)
Admission: EM | Admit: 2022-12-31 | Discharge: 2022-12-31 | Disposition: A | Discharge status: Home / Self Care | Payer: BC Managed Care – PPO | Attending: Emergency Medicine | Admitting: Emergency Medicine

## 2022-12-31 ENCOUNTER — Encounter (HOSPITAL_BASED_OUTPATIENT_CLINIC_OR_DEPARTMENT_OTHER): Payer: Self-pay | Admitting: Emergency Medicine

## 2022-12-31 ENCOUNTER — Other Ambulatory Visit: Payer: Self-pay

## 2022-12-31 DIAGNOSIS — M79605 Pain in left leg: Secondary | ICD-10-CM | POA: Diagnosis present

## 2022-12-31 DIAGNOSIS — R252 Cramp and spasm: Secondary | ICD-10-CM | POA: Insufficient documentation

## 2022-12-31 LAB — CBC
HCT: 37.8 % (ref 36.0–46.0)
Hemoglobin: 12.4 g/dL (ref 12.0–15.0)
MCH: 27.2 pg (ref 26.0–34.0)
MCHC: 32.8 g/dL (ref 30.0–36.0)
MCV: 82.9 fL (ref 80.0–100.0)
Platelets: 314 10*3/uL (ref 150–400)
RBC: 4.56 MIL/uL (ref 3.87–5.11)
RDW: 14.4 % (ref 11.5–15.5)
WBC: 8.2 10*3/uL (ref 4.0–10.5)
nRBC: 0 % (ref 0.0–0.2)

## 2022-12-31 LAB — BASIC METABOLIC PANEL
Anion gap: 9 (ref 5–15)
BUN: 10 mg/dL (ref 6–20)
CO2: 23 mmol/L (ref 22–32)
Calcium: 9.5 mg/dL (ref 8.9–10.3)
Chloride: 105 mmol/L (ref 98–111)
Creatinine, Ser: 0.73 mg/dL (ref 0.44–1.00)
GFR, Estimated: >60 mL/min (ref >60)
Glucose, Bld: 113 mg/dL — ABNORMAL HIGH (ref 70–99)
Potassium: 4.1 mmol/L (ref 3.5–5.1)
Sodium: 137 mmol/L (ref 135–145)

## 2022-12-31 LAB — MAGNESIUM: Magnesium: 2.1 mg/dL (ref 1.7–2.4)

## 2022-12-31 MED ORDER — DIAZEPAM 5 MG/ML IJ SOLN
5.0000 mg | Freq: Once | INTRAMUSCULAR | Status: AC
  Administered 2022-12-31: 5 mg via INTRAVENOUS
  Filled 2022-12-31: qty 2

## 2022-12-31 MED ORDER — CYCLOBENZAPRINE HCL 10 MG PO TABS: 10.0000 mg | ORAL_TABLET | Freq: Two times a day (BID) | ORAL | 0 refills | Status: AC | PRN

## 2022-12-31 MED ORDER — CYCLOBENZAPRINE HCL 10 MG PO TABS
10.0000 mg | ORAL_TABLET | Freq: Once | ORAL | Status: AC
  Administered 2022-12-31: 10 mg via ORAL
  Filled 2022-12-31: qty 1

## 2022-12-31 NOTE — ED Provider Notes (Signed)
[facility] Provider Note   CSN: [id number] Arrival date & time: 12/31/22  1345     History Chief Complaint  Patient presents with   Leg Pain    HPI Natasha Briggs is a 52 y.o. female presenting for left leg pain.  Denies fevers chills nausea vomiting shortness breath.  Otherwise ambulatory tolerating p.o. intake.  States that she is having muscle spasms of the left lower extremity.  History of restless leg syndrome normally not this severe.  Otherwise neurologically intact.  No other symptoms.   Patient's recorded medical, surgical, social, medication list and allergies were reviewed in the Snapshot window as part of the initial history.   Review of Systems   Review of Systems  Constitutional:  Negative for chills and fever.  HENT:  Negative for ear pain and sore throat.   Eyes:  Negative for pain and visual disturbance.  Respiratory:  Negative for cough and shortness of breath.   Cardiovascular:  Negative for chest pain and palpitations.  Gastrointestinal:  Negative for abdominal pain and vomiting.  Genitourinary:  Negative for dysuria and hematuria.  Musculoskeletal:  Negative for arthralgias and back pain.  Skin:  Negative for color change and rash.  Neurological:  Negative for seizures and syncope.  All other systems reviewed and are negative.   Physical Exam Updated Vital Signs BP 119/61   Pulse 88   Temp 98.1 F (36.7 C)   Resp 18   Ht 5\' 4"  (1.626 m)   Wt 103.4 kg   SpO2 96%   BMI 39.14 kg/m  Physical Exam Vitals and nursing note reviewed.  Constitutional:      General: She is not in acute distress.    Appearance: She is well-developed.  HENT:     Head: Normocephalic and atraumatic.  Eyes:     Conjunctiva/sclera: Conjunctivae normal.  Cardiovascular:     Rate and Rhythm: Normal rate and regular rhythm.     Heart sounds: No murmur heard. Pulmonary:     Effort: Pulmonary effort is normal. No respiratory  distress.     Breath sounds: Normal breath sounds.  Abdominal:     General: There is no distension.     Palpations: Abdomen is soft.     Tenderness: There is no abdominal tenderness. There is no right CVA tenderness or left CVA tenderness.  Musculoskeletal:        General: No swelling or tenderness. Normal range of motion.     Cervical back: Neck supple.     Comments: Random twitching of the left leg.  Patient will move legs suddenly to the side and back to the middle.  No neurosensory changes patient is intact.  Able to ambulate.  Skin:    General: Skin is warm and dry.  Neurological:     General: No focal deficit present.     Mental Status: She is alert and oriented to person, place, and time. Mental status is at baseline.     Cranial Nerves: No cranial nerve deficit.      ED Course/ Medical Decision Making/ A&P    Procedures Procedures   Medications Ordered in ED Medications  diazepam (VALIUM) injection 5 mg (5 mg Intravenous Given 12/31/22 1515)  cyclobenzaprine (FLEXERIL) tablet 10 mg (10 mg Oral Given 12/31/22 1636)    Medical Decision Making:   Patient is presenting with leg pain.  History of present on his films and findings are not consistent with any acute pathology.  Given  Valium with some mild improvement of symptoms but still residual.  Will start patient on muscle relaxer twice daily and have her follow-up with primary care provider in outpatient setting for likely exacerbation of her restless leg syndrome.  Unlikely to be any underlying fracture or infection central neurologic etiology based on this presentation.  No acute indication for intervention at this time.  Muscle relaxer prescribed.  Patient discharged with no further acute events. Clinical Impression:  1. Leg cramps      Discharge   Final Clinical Impression(s) / ED Diagnoses Final diagnoses:  Leg cramps    Rx / DC Orders ED Discharge Orders          Ordered    cyclobenzaprine (FLEXERIL) 10 MG  tablet  2 times daily PRN        12/31/22 1609              Tretha Sciara, MD 12/31/22 2229

## 2022-12-31 NOTE — ED Triage Notes (Signed)
Pt arrived POV, ambulatory. Pt caox4 c/o muscle spasms down L leg that started last night with no recent injury however reports fall 3/13 but stating no back/leg injury occurred from that fall. Last took Ibuprofen at 1200 this afternoon.

## 2023-03-14 ENCOUNTER — Ambulatory Visit: Payer: BC Managed Care – PPO | Admitting: Neurology

## 2023-03-14 ENCOUNTER — Encounter: Payer: Self-pay | Admitting: Neurology

## 2023-03-14 VITALS — BP 116/70 | HR 77 | Ht 64.0 in | Wt 225.0 lb

## 2023-03-14 DIAGNOSIS — Z820 Family history of epilepsy and other diseases of the nervous system: Secondary | ICD-10-CM

## 2023-03-14 DIAGNOSIS — G2581 Restless legs syndrome: Secondary | ICD-10-CM

## 2023-03-14 DIAGNOSIS — R519 Headache, unspecified: Secondary | ICD-10-CM

## 2023-03-14 DIAGNOSIS — G4733 Obstructive sleep apnea (adult) (pediatric): Secondary | ICD-10-CM

## 2023-03-14 DIAGNOSIS — R258 Other abnormal involuntary movements: Secondary | ICD-10-CM

## 2023-03-14 DIAGNOSIS — E669 Obesity, unspecified: Secondary | ICD-10-CM

## 2023-03-14 NOTE — Progress Notes (Signed)
Subjective:    Patient ID: Natasha Briggs is a 52 y.o. female.  HPI    Natasha Foley, MD, PhD [facility] [address], [address] [address] [place], [place] [address]  I saw patient, Natasha Briggs, as a referral from the emergency room for restless leg symptoms.  The patient is accompanied by her husband today.  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  Symptoms started typically at night when she is in bed, she has twitching in her legs, involuntary movements and symptoms of pressure sensation in her legs.  Her mom has restless leg syndrome as well and per patient has been on gabapentin as she recalls.  Her husband has noticed leg twitching when she is asleep, he has noticed this as long as they have been married, 8 or 9 years now.  She reports a fall about 3 months ago and feels that her restless leg symptoms have been worse.  She also has bilateral hip pain and takes ibuprofen as needed.  She has been on CPAP therapy or AutoPap therapy per PCP, she had a home sleep test reportedly about a year ago, test results are not available for my review today, a compliance download is not available for my review today.  Of note, she presented to the emergency room on 12/31/2022 with pain in the left leg and cramping.  She reported restless leg symptoms.  I reviewed the emergency room records.  She was treated symptomatically with Flexeril p.o. and Valium IV she had blood work including CBC, BMP and magnesium.  Test results were benign. She has not tried any medication through her PCP for this. In fact, she has not discussed her RLS with her  PCP. She has occasional morning headaches and attributes these to allergy flares.  She denies nocturia.  Bedtime is generally around 8 PM and rise time around 3 AM.  She reports full compliance and benefit from her PAP therapy.  She goes to work at 5, she works as a Regulatory affairs officer in [employer].  She does not currently drink any alcohol, she limits her caffeine to 1 cup of coffee in the morning and 1 diet soda per day.  She is a non-smoker.  For her hoarseness, she is supposed to see a new ENT later this month.  Of note, she has been on Celexa but this was changed about 7 or 8 months ago to Pristiq which is currently 50 mg daily.  She also takes the medication for hot flashes, Veozah.    Her Past Medical History Is Significant For: Past Medical History:  Diagnosis Date   Allergy    SEASONAL   Arthritis    LOWER BACK AND RIGHT KNEE   Depression    Diabetes mellitus without complication (HCC)    GERD (gastroesophageal reflux disease)    HPV (human papilloma virus) anogenital infection    Hypertension    Seasonal allergies    Vitamin D deficiency     Her Past Surgical History Is Significant For: Past Surgical History:  Procedure Laterality Date   DILATION AND CURETTAGE OF UTERUS     TUBAL LIGATION      Her  Family History Is Significant For: Family History  Problem Relation Age of Onset   Sleep apnea Mother    Restless legs syndrome Mother    Colon polyps Paternal Grandmother    Colon cancer Neg Hx    Esophageal cancer Neg Hx    Rectal cancer Neg Hx    Stomach cancer Neg Hx     Her Social History Is Significant For: Social History   Socioeconomic History   Marital status: Married    Spouse name: Not on file   Number of children: Not on file   Years of education: Not on file   Highest education level: Not on file  Occupational History   Not on file  Tobacco Use   Smoking status: Never   Smokeless tobacco: Never  Vaping Use   Vaping Use: Never used  Substance  and Sexual Activity   Alcohol use: Yes    Comment: OCC   Drug use: No   Sexual activity: Not on file  Other Topics Concern   Not on file  Social History Narrative   Not on file   Social Determinants of Health   Financial Resource Strain: Not on file  Food Insecurity: Not on file  Transportation Needs: Not on file  Physical Activity: Not on file  Stress: Not on file  Social Connections: Not on file    Her Allergies Are:  No Known Allergies:   Her Current Medications Are:  Outpatient Encounter Medications as of 03/14/2023  Medication Sig   acetaminophen (TYLENOL) 325 MG tablet Take 2 tablets (650 mg total) by mouth every 6 (six) hours as needed. (Patient taking differently: Take 650 mg by mouth as needed.)   acetaminophen (TYLENOL) 325 MG tablet Take 2 tablets (650 mg total) by mouth every 6 (six) hours as needed.   amLODipine (NORVASC) 2.5 MG tablet Take 2.5 mg by mouth daily.   aspirin EC 81 MG tablet Take 81 mg by mouth daily. Swallow whole.   desvenlafaxine (PRISTIQ) 50 MG 24 hr tablet Take 50 mg by mouth daily.   Fezolinetant (VEOZAH) 45 MG TABS Take by oral route for 30 days.   ibuprofen (ADVIL) 600 MG tablet Take 1 tablet (600 mg total) by mouth every 6 (six) hours as needed (take with food). (Patient taking differently: Take 600 mg by mouth as needed (take with food).)   loratadine (CLARITIN) 10 MG tablet Claritin 10 mg tablet   1 tablet every day by oral route.   Multiple Vitamin (MULTI-VITAMIN DAILY PO) daily.   naproxen (NAPROSYN) 500 MG tablet Take 1 tablet (500 mg total) by mouth 2 (two) times daily with a meal. (Patient taking differently: Take 500 mg by mouth as needed.)   pantoprazole (PROTONIX) 40 MG tablet daily.   pravastatin (PRAVACHOL) 10 MG tablet pravastatin 10 mg tablet   1 tablet every day by oral route.   Tirzepatide (MOUNJARO [place]) Inject 10 mg into the skin once a week.   citalopram (CELEXA) 20 MG tablet citalopram 20 mg tablet   1 tablet twice a day  by oral route.   cyclobenzaprine (FLEXERIL) 10 MG tablet Take 1 tablet (10 mg total) by mouth 2 (two) times daily as needed for muscle spasms. (Patient not taking: Reported on 03/14/2023)   doxycycline (VIBRAMYCIN) 100 MG capsule Take 1 capsule (100 mg total) by mouth 2 (two) times daily.   glyBURIDE (DIABETA) 5 MG tablet Take 2.5 mg by mouth daily as needed.   guaiFENesin (ROBITUSSIN) 100 MG/5ML SOLN Take  5 mLs (100 mg total) by mouth every 4 (four) hours as needed for cough or to loosen phlegm.   No facility-administered encounter medications on file as of 03/14/2023.  :   Review of Systems:  Out of a complete 14 point review of systems, all are reviewed and negative with the exception of these symptoms as listed below:   Review of Systems  Neurological:        Pt here for RLS consult Pt states movement in legs all the time  Pt states increased movement at night. Pt states fell March 2024  Pt states wears pap machine nightly     Objective:  Neurological Exam  Physical Exam Physical Examination:   Vitals:   03/14/23 0822  BP: 116/70  Pulse: 77    General Examination: The patient is a very pleasant 52 y.o. female in no acute distress. She appears well-developed and well-nourished and well groomed.   HEENT: Normocephalic, atraumatic, pupils are equal, round and reactive to light, extraocular tracking is good without limitation to gaze excursion or nystagmus noted. Hearing is grossly intact. Face is symmetric with normal facial animation. There is no hypophonia. There is no lip, neck/head, jaw or voice tremor. Neck is supple with full range of passive and active motion. There are no carotid bruits on auscultation. Oropharynx exam reveals: mild mouth dryness, adequate dental hygiene and moderate airway crowding. Tongue protrudes centrally and palate elevates symmetrically.  Speech slightly raspy but no dysarthria or hypophonia or voice tremor.  Chest: Clear to auscultation without  wheezing, rhonchi or crackles noted.  Heart: S1+S2+0, regular and normal without murmurs, rubs or gallops noted.   Abdomen: Soft, non-tender and non-distended.  Extremities: There is no pitting edema in the distal lower extremities bilaterally.   Skin: Warm and dry without trophic changes noted.   Musculoskeletal: exam reveals no obvious joint deformities, with the exception of mild right upper extremity deformity and limited range of motion due to birth injury.    Marland Kitchens Neurologically:  Mental status: The patient is awake, alert and oriented in all 4 spheres. Her immediate and remote memory, attention, language skills and fund of knowledge are appropriate. There is no evidence of aphasia, agnosia, apraxia or anomia. Speech is clear with normal prosody and enunciation. Thought process is linear. Mood is normal and affect is normal.  Cranial nerves II - XII are as described above under HEENT exam.  Motor exam: Normal bulk, strength and tone is noted. There is no obvious action or resting tremor.  No postural tremor, no drift or rebound. Reflexes 2+ throughout, toes are downgoing bilaterally. Fine motor skills and coordination: Normal fine motor skills in the upper extremity on the left, limited and decreased fine motor skills on the right but not a new problem.  Normal foot taps bilaterally in the lower extremities.  .  Cerebellar testing: No dysmetria or intention tremor. There is no truncal or gait ataxia.  Sensory exam: intact to light touch in the upper and lower extremities.  Gait, station and balance: She stands easily. No veering to one side is noted. No leaning to one side is noted. Posture is age-appropriate and stance is narrow based. Gait shows normal stride length and normal pace. No problems turning are noted.  Romberg is negative, tandem walk unremarkable.  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  We talked about restless leg syndrome today, she has not had much in the way of workup ordered symptomatic treatment through PCP as I understand.  I have no prior records from PCP or sleep study report.  She has been using his CPAP consistently and reports good results in full compliance, a compliance download is not available today.  I suggested we proceed with further evaluation for restless legs with a polysomnogram as well as some blood work today to look for thyroid dysfunction, iron deficiency, anemia, and vitamin B12 deficiency.  We will call her with her blood test results and her sleep study results.  She is advised to use her CPAP consistently and stay well-hydrated and well rested.  She already limits her caffeine and does not currently drink any alcohol.  Exam is nonfocal today with the exception of known limited range of motion in the right upper extremity from an injury at birth.  She is advised that certain medications including antidepressant medications can exacerbate restless leg symptoms and leg twitching at night including Celexa which she is no longer on and Pristiq, which she has been on for the past 7 or 8 months.  We may consider symptomatic treatment such as a dopamine agonist or off label use of gabapentin, her mom reportedly is on gabapentin for restless leg syndrome.  We will reconvene after testing.  I answered all the questions today and the patient and her husband were in agreement.   Natasha Foley, MD, PhD

## 2023-03-14 NOTE — Patient Instructions (Addendum)
Restless leg symptoms can be induced are aggravated by certain conditions including thyroid dysfunction and anemia or iron deficiency. Certain medications can induce leg twitching at night, which we call periodic leg movements and disrupt sleep and also exacerbate restless leg symptoms during wakefulness. These medications often include antidepressants.  We will do some blood work today and call you with the test results. I may ask you to start an over-the-counter iron supplement if your storage iron values are low. We may also consider a prescription medication to help with restless legs symptoms.    Please continue using your CPAP regularly. Untreated obstructive sleep apnea when it is moderate to severe can have an adverse impact on cardiovascular health and raise her risk for heart disease, arrhythmias, hypertension, congestive heart failure, stroke and diabetes. Untreated obstructive sleep apnea causes sleep disruption, nonrestorative sleep, and sleep deprivation. It can also aggravate restless legs and cause leg twitching. Using CPAP regularly can improve these symptoms.  We will check blood work today and call you with the test results.  I will order a sleep study to further look at your leg twitching at night in your sleep.

## 2023-03-15 ENCOUNTER — Telehealth: Payer: Self-pay

## 2023-03-15 LAB — B12 AND FOLATE PANEL
Folate: >20 ng/mL (ref >3.0)
Vitamin B-12: 478 pg/mL (ref 232–1245)

## 2023-03-15 LAB — TSH: TSH: 2.74 u[IU]/mL (ref 0.450–4.500)

## 2023-03-15 LAB — IRON AND TIBC
Iron Saturation: 12 % — ABNORMAL LOW (ref 15–55)
Iron: 48 ug/dL (ref 27–159)
Total Iron Binding Capacity: 399 ug/dL (ref 250–450)
UIBC: 351 ug/dL (ref 131–425)

## 2023-03-15 LAB — FERRITIN: Ferritin: 23 ng/mL (ref 15–150)

## 2023-03-15 NOTE — Telephone Encounter (Signed)
Call to patient, no answer, left message to return call to review lab results

## 2023-03-15 NOTE — Telephone Encounter (Signed)
Call from patient and reviewed labs results per Dr. Frances Furbish note. Patient stated she only had anemia during pregnancy and has since resolved. She agrees to recommendation s of starting over the counter iron supplement and following up with PCP. Appreciative of call

## 2023-03-15 NOTE — Telephone Encounter (Signed)
-----   Message from Huston Foley, MD sent at 03/15/2023  7:49 AM EDT ----- Please advise patient that her iron storage is mildly low, we recommend especially in patients who have restless leg symptoms that the ferritin - which is your storage iron -  be above 50, hers is 23.  I recommend she start an over-the-counter iron supplement.  She can choose any iron supplement of her liking and I recommend that she have her iron studies rechecked by PCP in about 6 months.  If she has any history of anemia, I recommend that she follow-up with primary care and discuss further anemia and iron deficiency workup.  Thyroid screening test called TSH was normal.  Her vitamin B12 was in the normal range as well.

## 2023-03-15 NOTE — Telephone Encounter (Signed)
-----   Message from Saima Athar, MD sent at 03/15/2023  7:49 AM EDT ----- Please advise patient that her iron storage is mildly low, we recommend especially in patients who have restless leg symptoms that the ferritin - which is your storage iron -  be above 50, hers is 23.  I recommend she start an over-the-counter iron supplement.  She can choose any iron supplement of her liking and I recommend that she have her iron studies rechecked by PCP in about 6 months.  If she has any history of anemia, I recommend that she follow-up with primary care and discuss further anemia and iron deficiency workup.  Thyroid screening test called TSH was normal.  Her vitamin B12 was in the normal range as well. 

## 2023-03-27 ENCOUNTER — Telehealth: Payer: Self-pay | Admitting: Neurology

## 2023-03-27 NOTE — Telephone Encounter (Signed)
[employer] [place] pending faxed notes  

## 2023-05-01 NOTE — Telephone Encounter (Signed)
[employer]- [employer] [place] no auth req via fax form   Patient is scheduled at [facility] for 07/11/23 at 9 pm.  Mailed packet to the patient.

## 2023-07-11 ENCOUNTER — Ambulatory Visit (INDEPENDENT_AMBULATORY_CARE_PROVIDER_SITE_OTHER): Payer: BC Managed Care – PPO | Admitting: Neurology

## 2023-07-11 DIAGNOSIS — R519 Headache, unspecified: Secondary | ICD-10-CM

## 2023-07-11 DIAGNOSIS — E669 Obesity, unspecified: Secondary | ICD-10-CM

## 2023-07-11 DIAGNOSIS — G4733 Obstructive sleep apnea (adult) (pediatric): Secondary | ICD-10-CM

## 2023-07-11 DIAGNOSIS — G2581 Restless legs syndrome: Secondary | ICD-10-CM

## 2023-07-11 DIAGNOSIS — R258 Other abnormal involuntary movements: Secondary | ICD-10-CM

## 2023-07-11 DIAGNOSIS — G4720 Circadian rhythm sleep disorder, unspecified type: Secondary | ICD-10-CM

## 2023-07-11 DIAGNOSIS — Z820 Family history of epilepsy and other diseases of the nervous system: Secondary | ICD-10-CM

## 2023-07-13 NOTE — Procedures (Signed)
Physician Interpretation:     [facility] POLYSOMNOGRAPHY  INTERPRETATION REPORT   STUDY DATE:  07/11/2023     PATIENT NAME:  Natasha Briggs         DATE OF BIRTH:  [birth year]  PATIENT ID:  [id number]    TYPE OF STUDY:  PSG  READING PHYSICIAN: Huston Foley, MD, PhD   SCORING TECHNICIAN: Margaretann Loveless, RPSGT     Referred by: ER ? 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  Height: 64 in Weight: 225 lb (BMI 38) Neck Size: 0     MEDICATIONS: Tylenol, Norvasc, Aspirin, Pristiq, Veozah, Advil, Claritin, Mulit-Vitamin, Naproxen, Protonix, Pravachol, Mounjaro, Celexa, Flexeril, Vibramycin, Diabeta, Robitussin   TECHNICAL DESCRIPTION: A registered sleep technologist was in attendance for the duration of the recording.  Data collection, scoring, video monitoring, and reporting were performed in compliance with the AASM Manual for the Scoring of Sleep and Associated Events; (Hypopnea is scored based on the criteria listed in Section VIII D. 1b in the AASM Manual V2.6 using a 4% oxygen desaturation rule or Hypopnea is scored based on the criteria listed in Section VIII D. 1a in the AASM Manual V2.6 using 3% oxygen desaturation and /or arousal rule).   SLEEP CONTINUITY AND SLEEP ARCHITECTURE:  Lights-out was at 21:51: and lights-on at  05:17:, with a total recording time of 7 hours, 26 min. Total sleep time ( TST) was 385.5 minutes with a normal sleep efficiency at 86.4%. There was  13.2% REM sleep.  BODY POSITION:  TST was divided  between the following sleep positions: 38.1%  supine;  61.9% lateral;  0% prone. Duration of total sleep and percent of total sleep in their respective position is as follows: supine 147 minutes (38%), non-supine 239 minutes (62%); right 172 minutes (45%), left 66 minutes (17%), and prone 00 minutes (0%).  Total supine REM sleep time was 42 minutes (82% of total REM sleep).  Sleep latency was normal at 19.5 minutes.  REM sleep latency was markedly increased at 243.0 minutes. Of the total sleep time, the percentage of stage N1 sleep was 5.4%, stage N2 sleep was 71%, whcih is increased, stage N3 sleep was 10.1%, which is reduced, and REM sleep was 13.2%, which is reduced. Wake after sleep onset (WASO) time accounted for 41 minutes, with sleep fragmentation noted, ranging from minimal to moderate.   RESPIRATORY MONITORING:   Based on CMS criteria (using a 4% oxygen desaturation rule for scoring hypopneas), there were 0 apneas (0 obstructive; 0 central; 0 mixed), and 152 hypopneas.  Apnea index was 0.0. Hypopnea index was 23.7. The apnea-hypopnea index was 23.7 overall (35.5 supine, 13 non-supine; 28.2 REM, 31.4 supine REM).  There were 0 respiratory effort-related arousals (RERAs).  The RERA index was 0 events/h. Total respiratory disturbance index (RDI) was 23.7 events/h. RDI results showed: supine RDI  35.5 /h; non-supine RDI 16.4 /h; REM RDI 28.2 /h, supine REM RDI 31.4 /h.   Based on AASM criteria (using a 3% oxygen desaturation and /or arousal rule for scoring hypopneas), there were 0 apneas (0 obstructive; 0 central; 0 mixed), and 209 hypopneas. Apnea index was 0.0.  Hypopnea index was 32.5. The apnea-hypopnea index was 32.5/hour overall (45.7 supine, 20 non-supine; 36.5 REM, 40.0 supine REM).  There were 0 respiratory effort-related arousals (RERAs).  The RERA index was 0 events/h. Total respiratory disturbance index (RDI) was 32.5 events/h. RDI results showed: supine RDI  45.7 /h; non-supine RDI 24.4 /h; REM RDI 36.5 /h, supine REM RDI 40.0 /h.     OXIMETRY: Oxyhemoglobin Saturation Nadir during sleep was at  79%) from a mean of 93%.  Of the Total sleep time (TST)   hypoxemia (=<88%) was present for  10.1 minutes, or 2.6% of total sleep time.  LIMB MOVEMENTS: There were 0 periodic limb movements of sleep (0.0/hr), of which 0 (0.0/hr) were associated with an arousal.   AROUSAL: There were 144 arousals in total, for an arousal index of 22 arousals/hour.  Of these, 86 were identified as respiratory-related arousals (13 /h), 0 were PLM-related arousals (0 /h), and 77 were non-specific arousals (12 /h).   EEG: Review of the EEG showed no abnormal electrical discharges and symmetrical bihemispheric findings.    EKG: The EKG revealed normal sinus rhythm (NSR). The average heart rate during sleep was 71 bpm.  AUDIO/VIDEO REVIEW: The audio and video review did not show any abnormal or unusual behaviors, movements, phonations or vocalizations. The patient took no restroom breaks. Snoring was noted, in the moderate to loud range.   POST-STUDY QUESTIONNAIRE: Post study, the patient indicated, that sleep was worse than usual.   IMPRESSION:   1. Severe Obstructive Sleep Apnea (OSA) 2. Dysfunctions associated with sleep stages or arousal from sleep  RECOMMENDATIONS:   1. This study demonstrates severe obstructive sleep apnea, with a total AHI of 32.5/h, supine AHI of 45.7/hour, REM AHI of 36.5/hour, and O2 nadir of 79% (during non-supine REM sleep). This patient has an established diagnosis of obstructive sleep apnea and uses a PAP machine at home.  The patient will be reminded to be consistent with her PAP machine and follow-up regularly with her sleep provider.  Concomitant weight loss is recommended. Please note that untreated obstructive sleep apnea may carry additional perioperative morbidity. Patients with significant obstructive sleep apnea should receive perioperative PAP therapy and the surgeons and particularly the anesthesiologist should be  informed of the diagnosis and the severity of the sleep disordered breathing. 2. This study shows some sleep fragmentation and abnormal sleep stage percentages; these are nonspecific findings and per se do not signify an intrinsic sleep disorder or a cause for the patient's sleep-related symptoms. Causes include (but are not limited to) the first night effect of the sleep study, circadian rhythm disturbances, medication effect or an underlying mood disorder or medical problem.  No significant periodic leg movements were noted during the study. RLS (restless legs syndrome) symptoms may improve with consistent OSA treatment.  3. The patient should be cautioned not to drive, work at heights, or operate dangerous or heavy equipment when tired or sleepy. Review and reiteration of good sleep hygiene measures should be pursued with any patient. 4. The patient will be seen in follow-up in the sleep clinic at [facility] for discussion of the test results, symptom and treatment compliance review, further management strategies, etc. The patient and the referring provider will be notified of the test results.  I certify that I have reviewed the entire raw data recording prior to the issuance of this report in accordance with the Standards of Accreditation of the American Academy of Sleep Medicine (AASM).  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 Symptoms started typically at night when she is in bed, she has twitching in her legs, involuntary movements and symptoms of pressure sensation in her legs. Her mom has restless leg syndrome as well and per patient has been on gabapentin as she recalls. Her husband has noticed leg twitching when she is asleep, he has noticed this as long as they have been married, 8 or 9 years now. She reports a fall about 3 months ago and feels that her restless leg symptoms have been worse. She also has bilateral hip pain and takes ibuprofen as needed. She has been on CPAP therapy or AutoPap therapy per PCP, she had a home sleep test reportedly about a year ago, test results are not available for my review today, a compliance download is not available for my review today. Tylenol, Norvasc, Aspirin, Pristiq, Veozah, Advil, Claritin, Mulit-Vitamin, Naproxen, Protonix, Pravachol, Mounjaro, Celexa, Flexeril, Vibramycin, Diabeta, Robitussin   Sleep Disorder      Comments   The patient came into the lab for a PSG. Per the patient not taking Flexeril. The patient was not split due to already being established with a different sleep facility. The patient had an HST in 2023 with another sleep lab. The patient is already on CPAP at home. The patient had no restroom breaks. EKG kept in NSR. Moderate to loud. All sleep stages witnessed. Respiratory events scored with a 3% desat. Slept lateral and supine. Respiratory events are worse while supine. AHI was 41.9 after 2 hrs of TST. Some leg movements noted. No PLM's.     Lights out: 09:51:32 PM Lights on: 05:17:49 AM   Time Total Supine Side Prone Upright  Recording (TRT) 7h 26.66m 2h 44.69m 4h 41.38m 0h 0.66m 0h 0.69m  Sleep (TST) 6h 25.39m 2h 27.55m 3h 58.67m 0h 0.28m 0h 0.65m    Latency N1 N2 N3 REM Onset Per. Slp. Eff.  Actual 0h 0.61m 0h 2.14m 0h 15.29m 4h 3.66m 0h 19.38m 0h 20.42m 86.43%   Stg Dur Wake N1 N2 N3 REM  Total 60.5 21.0 274.5 39.0 51.0  Supine 17.5 8.0 97.0 0.0 42.0  Side 43.0 13.0 177.5 39.0 9.0  Prone 0.0 0.0 0.0 0.0 0.0  Upright 0.0 0.0 0.0 0.0 0.0   Stg % Wake N1 N2 N3 REM  Total 13.6 5.4 71.2 10.1 13.2  Supine 3.9 2.1 25.2 0.0 10.9  Side 9.6 3.4 46.0 10.1 2.3  Prone 0.0 0.0 0.0 0.0 0.0  Upright 0.0 0.0 0.0 0.0 0.0     Apnea Summary Sub Supine Side Prone Upright  Total 0 Total 0 0 0 0 0    REM 0 0 0 0 0    NREM 0 0 0 0 0  Obs  0 REM 0 0 0 0 0    NREM 0 0 0 0 0  Mix 0 REM 0 0 0 0 0    NREM 0 0 0 0 0  Cen 0 REM 0 0 0 0 0    NREM 0 0 0 0 0   Rera Summary Sub Supine Side Prone Upright  Total 0 Total 0 0 0 0 0    REM 0 0 0 0 0    NREM 0 0 0 0 0   Hypopnea Summary Sub Supine Side Prone Upright  Total 209 Total 209 112 97 0 0    REM 31 28 3  0 0    NREM 178 84 94 0 0   4% Hypopnea Summary Sub Supine Side Prone Upright  Total (4%) 152 Total 152 87 65 0 0    REM 24 22 2  0 0    NREM 128 65 63 0 0     AHI Total Obs Mix Cen  32.53 Apnea 0.00 0.00 0.00 0.00   Hypopnea 32.53 -- -- --  23.66 Hypopnea (4%) 23.66 -- -- --    Total Supine Side Prone Upright  Position AHI 32.53 45.71 24.40 0.00 0.00  REM AHI 36.47   NREM AHI 31.93   Position RDI 32.53 45.71 24.40 0.00 0.00  REM RDI 36.47   NREM RDI 31.93    4% Hypopnea Total Supine Side Prone Upright  Position AHI (4%) 23.66 35.51 16.35 0.00 0.00  REM AHI (4%) 28.24   NREM AHI (4%) 22.96   Position RDI (4%) 23.66 35.51 16.35 0.00 0.00  REM RDI (4%) 28.24   NREM RDI (4%) 22.96    Desaturation Information Threshold: 2% <100% <90% <80% <70% <60% <50% <40%  Supine 146.0 49.0 0.0 0.0 0.0 0.0 0.0  Side 202.0 30.0 1.0 0.0 0.0 0.0 0.0  Prone 0.0 0.0 0.0 0.0 0.0 0.0 0.0  Upright 0.0 0.0 0.0 0.0 0.0 0.0 0.0  Total 348.0 79.0 1.0 0.0 0.0 0.0 0.0  Index 49.1 11.1 0.1 0.0 0.0 0.0 0.0    Threshold: 3% <100% <90% <80% <70% <60% <50% <40%  Supine 122.0 48.0 0.0 0.0 0.0 0.0 0.0  Side 123.0 30.0 1.0 0.0 0.0 0.0 0.0  Prone 0.0 0.0 0.0 0.0 0.0 0.0 0.0  Upright 0.0 0.0 0.0 0.0 0.0 0.0 0.0  Total 245.0 78.0 1.0 0.0 0.0 0.0 0.0  Index 34.5 11.0 0.1 0.0 0.0 0.0 0.0   Threshold: 4% <100% <90% <80% <70% <60% <50% <40%  Supine 91.0 45.0 0.0 0.0 0.0 0.0 0.0  Side 80.0 30.0 1.0 0.0 0.0 0.0 0.0  Prone 0.0 0.0 0.0 0.0 0.0 0.0 0.0  Upright 0.0 0.0 0.0 0.0 0.0 0.0 0.0  Total 171.0 75.0 1.0 0.0 0.0 0.0 0.0  Index 24.1 10.6 0.1 0.0 0.0 0.0 0.0   Threshold: 3% <100% <90% <80% <70% <60% <50% <40%  Supine 122 48 0 0 0 0 0  Side 123 30 1 0 0 0 0  Prone 0 0 0 0 0 0 0  Upright 0 0 0 0 0 0 0  Total 245 78 1 0 0 0 0   Awakening/Arousal Information # of Awakenings 33  Wake after sleep onset 41.36m  Wake after persistent sleep 40.7m   Arousal Assoc. Arousals Index  Apneas 0 0.0  Hypopneas 86 13.4  Leg Movements 0 0.0  Snore 0 0.0  PTT Arousals 0 0.0  Spontaneous 77 12.0  Total 163 25.4  Leg  Movement Information PLMS LMs Index  Total LMs during PLMS 0 0.0  LMs w/ Microarousals 0 0.0   LM LMs Index  w/ Microarousal 0 0.0  w/ Awakening 0 0.0  w/ Resp Event 4 0.6  Spontaneous 16 2.5  Total 20 3.1     Desaturation threshold setting: 3% Minimum desaturation setting: 10 seconds SaO2 nadir: 79% The longest event was a 60 sec obstructive Hypopnea with a minimum SaO2 of 83%. The lowest SaO2 was 79% associated with a 23 sec obstructive Hypopnea. EKG Rates EKG Avg Max Min  Awake 75 98 62  Asleep 71 88 62  EKG Events: Tachycardia

## 2023-07-19 NOTE — Telephone Encounter (Signed)
Please contact pt to schedule 3 mo FU with Dr Frances Furbish.

## 2024-05-21 ENCOUNTER — Encounter (HOSPITAL_COMMUNITY): Payer: Self-pay | Admitting: Obstetrics and Gynecology

## 2024-05-21 NOTE — Progress Notes (Signed)
 Spoke w/ via phone for pre-op interview--- Natasha Briggs needs dos----  CBG, A1c, BMP and EKG per anesthesia.       Briggs results------ COVID test -----patient states asymptomatic no test needed Arrive at -------1000 NPO after MN NO Solid Food.  Clear liquids from MN until---0900 Pre-Surgery Ensure or G2:  Med rec completed Medications to take morning of surgery -----Pristiq Diabetic medication -----  GLP1 agonist last dose: Mounjaro 10MG  weekly. Last dose 05/16/24.  GLP1 instructions: instructed to not take anymore doses until after procedure. Pt verbalized understanding.  Patient instructed no nail polish to be worn day of surgery Patient instructed to bring photo id and insurance card day of surgery Patient aware to have Driver (ride ) / caregiver    for 24 hours after surgery - Husband Natasha Briggs Patient Special Instructions ----- Pre-Op special Instructions -----  Patient verbalized understanding of instructions that were given at this phone interview. Patient denies chest pain, sob, fever, cough at the interview.

## 2024-05-27 ENCOUNTER — Ambulatory Visit (HOSPITAL_COMMUNITY): Admitting: Certified Registered Nurse Anesthetist

## 2024-05-27 ENCOUNTER — Encounter (HOSPITAL_COMMUNITY): Payer: Self-pay | Admitting: Obstetrics and Gynecology

## 2024-05-27 ENCOUNTER — Other Ambulatory Visit: Payer: Self-pay

## 2024-05-27 ENCOUNTER — Encounter (HOSPITAL_COMMUNITY)
Admission: RE | Disposition: A | Discharge status: Home / Self Care | Payer: Self-pay | Source: Home / Self Care | Attending: Obstetrics and Gynecology

## 2024-05-27 ENCOUNTER — Ambulatory Visit (HOSPITAL_COMMUNITY)
Admission: RE | Admit: 2024-05-27 | Discharge: 2024-05-27 | Disposition: A | Discharge status: Home / Self Care | Attending: Obstetrics and Gynecology | Admitting: Obstetrics and Gynecology

## 2024-05-27 ENCOUNTER — Other Ambulatory Visit: Payer: Self-pay | Admitting: Obstetrics and Gynecology

## 2024-05-27 DIAGNOSIS — F32A Depression, unspecified: Secondary | ICD-10-CM | POA: Diagnosis not present

## 2024-05-27 DIAGNOSIS — E119 Type 2 diabetes mellitus without complications: Secondary | ICD-10-CM | POA: Insufficient documentation

## 2024-05-27 DIAGNOSIS — K219 Gastro-esophageal reflux disease without esophagitis: Secondary | ICD-10-CM | POA: Insufficient documentation

## 2024-05-27 DIAGNOSIS — N939 Abnormal uterine and vaginal bleeding, unspecified: Secondary | ICD-10-CM | POA: Diagnosis not present

## 2024-05-27 DIAGNOSIS — G4730 Sleep apnea, unspecified: Secondary | ICD-10-CM | POA: Insufficient documentation

## 2024-05-27 DIAGNOSIS — R9389 Abnormal findings on diagnostic imaging of other specified body structures: Secondary | ICD-10-CM | POA: Insufficient documentation

## 2024-05-27 DIAGNOSIS — N950 Postmenopausal bleeding: Secondary | ICD-10-CM

## 2024-05-27 DIAGNOSIS — Z7985 Long-term (current) use of injectable non-insulin antidiabetic drugs: Secondary | ICD-10-CM | POA: Insufficient documentation

## 2024-05-27 DIAGNOSIS — Z780 Asymptomatic menopausal state: Secondary | ICD-10-CM | POA: Diagnosis not present

## 2024-05-27 DIAGNOSIS — Z79899 Other long term (current) drug therapy: Secondary | ICD-10-CM | POA: Diagnosis not present

## 2024-05-27 DIAGNOSIS — D250 Submucous leiomyoma of uterus: Secondary | ICD-10-CM | POA: Insufficient documentation

## 2024-05-27 HISTORY — PX: MYOSURE RESECTION: SHX7611

## 2024-05-27 HISTORY — PX: DILATATION & CURRETTAGE/HYSTEROSCOPY WITH RESECTOCOPE: SHX5572

## 2024-05-27 HISTORY — DX: Sleep apnea, unspecified: G47.30

## 2024-05-27 LAB — GLUCOSE, CAPILLARY
Glucose-Capillary: 97 mg/dL (ref 70–99)
Glucose-Capillary: 115 mg/dL — ABNORMAL HIGH (ref 70–99)

## 2024-05-27 LAB — CBC
HCT: 46.1 % — ABNORMAL HIGH (ref 36.0–46.0)
Hemoglobin: 15.3 g/dL — ABNORMAL HIGH (ref 12.0–15.0)
MCH: 29.3 pg (ref 26.0–34.0)
MCHC: 33.2 g/dL (ref 30.0–36.0)
MCV: 88.1 fL (ref 80.0–100.0)
Platelets: 298 K/uL (ref 150–400)
RBC: 5.23 MIL/uL — ABNORMAL HIGH (ref 3.87–5.11)
RDW: 13.8 % (ref 11.5–15.5)
WBC: 5 K/uL (ref 4.0–10.5)
nRBC: 0 % (ref 0.0–0.2)

## 2024-05-27 LAB — HEMOGLOBIN A1C
Hgb A1c MFr Bld: 6.2 % — ABNORMAL HIGH (ref 4.8–5.6)
Mean Plasma Glucose: 131.24 mg/dL

## 2024-05-27 LAB — BASIC METABOLIC PANEL WITH GFR
Anion gap: 6 (ref 5–15)
BUN: 11 mg/dL (ref 6–20)
CO2: 25 mmol/L (ref 22–32)
Calcium: 9.3 mg/dL (ref 8.9–10.3)
Chloride: 108 mmol/L (ref 98–111)
Creatinine, Ser: 0.62 mg/dL (ref 0.44–1.00)
GFR, Estimated: >60 mL/min (ref >60)
Glucose, Bld: 102 mg/dL — ABNORMAL HIGH (ref 70–99)
Potassium: 3.7 mmol/L (ref 3.5–5.1)
Sodium: 139 mmol/L (ref 135–145)

## 2024-05-27 LAB — TYPE AND SCREEN
ABO/RH(D): AB POS
Antibody Screen: NEGATIVE

## 2024-05-27 LAB — ABO/RH: ABO/RH(D): AB POS

## 2024-05-27 SURGERY — DILATATION & CURETTAGE/HYSTEROSCOPY WITH RESECTOCOPE
Anesthesia: General | Site: Uterus

## 2024-05-27 MED ORDER — OXYCODONE HCL 5 MG/5ML PO SOLN: 5.0000 mg | Freq: Once | ORAL | Status: DC | PRN

## 2024-05-27 MED ORDER — FENTANYL CITRATE (PF) 100 MCG/2ML IJ SOLN
INTRAMUSCULAR | Status: AC
Start: 2024-05-27 — End: 2024-05-27
  Filled 2024-05-27: qty 2

## 2024-05-27 MED ORDER — OXYCODONE HCL 5 MG PO TABS: 5.0000 mg | ORAL_TABLET | Freq: Once | ORAL | Status: DC | PRN

## 2024-05-27 MED ORDER — PROPOFOL 10 MG/ML IV BOLUS
INTRAVENOUS | Status: AC
  Filled 2024-05-27: qty 20

## 2024-05-27 MED ORDER — FENTANYL CITRATE (PF) 250 MCG/5ML IJ SOLN
INTRAMUSCULAR | Status: AC
  Filled 2024-05-27: qty 5

## 2024-05-27 MED ORDER — DEXAMETHASONE SODIUM PHOSPHATE 10 MG/ML IJ SOLN
INTRAMUSCULAR | Status: DC | PRN
  Administered 2024-05-27: 8 mg via INTRAVENOUS

## 2024-05-27 MED ORDER — PHENYLEPHRINE 80 MCG/ML (10ML) SYRINGE FOR IV PUSH (FOR BLOOD PRESSURE SUPPORT)
PREFILLED_SYRINGE | INTRAVENOUS | Status: DC | PRN
  Administered 2024-05-27 (×2): 80 ug via INTRAVENOUS

## 2024-05-27 MED ORDER — MIDAZOLAM HCL 2 MG/2ML IJ SOLN
INTRAMUSCULAR | Status: DC | PRN
  Administered 2024-05-27: 2 mg via INTRAVENOUS

## 2024-05-27 MED ORDER — EPHEDRINE SULFATE-NACL 50-0.9 MG/10ML-% IV SOSY
PREFILLED_SYRINGE | INTRAVENOUS | Status: DC | PRN
  Administered 2024-05-27: 10 mg via INTRAVENOUS

## 2024-05-27 MED ORDER — MIDAZOLAM HCL 2 MG/2ML IJ SOLN
INTRAMUSCULAR | Status: AC
  Filled 2024-05-27: qty 2

## 2024-05-27 MED ORDER — CHLORHEXIDINE GLUCONATE 0.12 % MT SOLN
15.0000 mL | Freq: Once | OROMUCOSAL | Status: AC
  Administered 2024-05-27: 15 mL via OROMUCOSAL

## 2024-05-27 MED ORDER — FENTANYL CITRATE (PF) 100 MCG/2ML IJ SOLN
25.0000 ug | INTRAMUSCULAR | Status: DC | PRN
  Administered 2024-05-27: 50 ug via INTRAVENOUS
  Administered 2024-05-27: 25 ug via INTRAVENOUS

## 2024-05-27 MED ORDER — LIDOCAINE HCL (PF) 1 % IJ SOLN
INTRAMUSCULAR | Status: AC
  Filled 2024-05-27: qty 30

## 2024-05-27 MED ORDER — LIDOCAINE HCL (CARDIAC) PF 100 MG/5ML IV SOSY
PREFILLED_SYRINGE | INTRAVENOUS | Status: DC | PRN
  Administered 2024-05-27: 60 mg via INTRATRACHEAL

## 2024-05-27 MED ORDER — PHENYLEPHRINE 80 MCG/ML (10ML) SYRINGE FOR IV PUSH (FOR BLOOD PRESSURE SUPPORT)
PREFILLED_SYRINGE | INTRAVENOUS | Status: AC
Start: 2024-05-27 — End: 2024-05-27
  Filled 2024-05-27: qty 10

## 2024-05-27 MED ORDER — LIDOCAINE HCL 1 % IJ SOLN
INTRAMUSCULAR | Status: DC | PRN
  Administered 2024-05-27: 10 mL

## 2024-05-27 MED ORDER — PROPOFOL 10 MG/ML IV BOLUS
INTRAVENOUS | Status: DC | PRN
  Administered 2024-05-27: 50 mg via INTRAVENOUS
  Administered 2024-05-27: 200 mg via INTRAVENOUS

## 2024-05-27 MED ORDER — IBUPROFEN 800 MG PO TABS
800.0000 mg | ORAL_TABLET | Freq: Three times a day (TID) | ORAL | 0 refills | Status: AC | PRN
Start: 2024-05-27 — End: ?

## 2024-05-27 MED ORDER — POVIDONE-IODINE 10 % EX SWAB: 2.0000 | Freq: Once | CUTANEOUS | Status: DC

## 2024-05-27 MED ORDER — AMISULPRIDE (ANTIEMETIC) 5 MG/2ML IV SOLN: 10.0000 mg | Freq: Once | INTRAVENOUS | Status: DC | PRN

## 2024-05-27 MED ORDER — FENTANYL CITRATE (PF) 250 MCG/5ML IJ SOLN
INTRAMUSCULAR | Status: DC | PRN
  Administered 2024-05-27: 100 ug via INTRAVENOUS

## 2024-05-27 MED ORDER — SODIUM CHLORIDE 0.9 % IR SOLN
Status: DC | PRN
  Administered 2024-05-27 (×2): 3000 mL

## 2024-05-27 MED ORDER — MIDAZOLAM HCL 2 MG/2ML IJ SOLN
INTRAMUSCULAR | Status: AC
Start: 2024-05-27 — End: 2024-05-27
  Filled 2024-05-27: qty 2

## 2024-05-27 MED ORDER — LACTATED RINGERS IV SOLN: INTRAVENOUS | Status: DC

## 2024-05-27 MED ORDER — VASOPRESSIN 20 UNIT/ML IV SOLN
INTRAVENOUS | Status: AC
  Filled 2024-05-27: qty 1

## 2024-05-27 MED ORDER — KETOROLAC TROMETHAMINE 30 MG/ML IJ SOLN: 30.0000 mg | Freq: Once | INTRAMUSCULAR | Status: DC | PRN

## 2024-05-27 MED ORDER — ORAL CARE MOUTH RINSE: 15.0000 mL | Freq: Once | OROMUCOSAL | Status: AC

## 2024-05-27 MED ORDER — LACTATED RINGERS IV SOLN: INTRAVENOUS | Status: DC | PRN

## 2024-05-27 MED ORDER — LIDOCAINE 2% (20 MG/ML) 5 ML SYRINGE
INTRAMUSCULAR | Status: AC
  Filled 2024-05-27: qty 5

## 2024-05-27 MED ORDER — HYDROCODONE-ACETAMINOPHEN 5-325 MG PO TABS: 1.0000 | ORAL_TABLET | Freq: Four times a day (QID) | ORAL | 0 refills | Status: AC | PRN

## 2024-05-27 MED ORDER — KETOROLAC TROMETHAMINE 30 MG/ML IJ SOLN
INTRAMUSCULAR | Status: DC | PRN
  Administered 2024-05-27: 30 mg via INTRAVENOUS

## 2024-05-27 MED ORDER — CHLORHEXIDINE GLUCONATE 0.12 % MT SOLN
OROMUCOSAL | Status: AC
  Filled 2024-05-27: qty 15

## 2024-05-27 MED ORDER — KETOROLAC TROMETHAMINE 30 MG/ML IJ SOLN
INTRAMUSCULAR | Status: AC
  Filled 2024-05-27: qty 1

## 2024-05-27 MED ORDER — ACETAMINOPHEN 10 MG/ML IV SOLN: 1000.0000 mg | Freq: Once | INTRAVENOUS | Status: DC | PRN

## 2024-05-27 SURGICAL SUPPLY — 17 items
ABLATOR SURESOUND NOVASURE (ABLATOR) IMPLANT
CANISTER SUCTION 3000ML PPV (SUCTIONS) ×1 IMPLANT
CATH ROBINSON RED A/P 16FR (CATHETERS) ×1 IMPLANT
COVER MAYO STAND STRL (DRAPES) ×1 IMPLANT
DEVICE MYOSURE LITE (MISCELLANEOUS) IMPLANT
DEVICE MYOSURE REACH (MISCELLANEOUS) IMPLANT
DILATOR CANAL MILEX (MISCELLANEOUS) IMPLANT
GLOVE BIO SURGEON STRL SZ7 (GLOVE) ×1 IMPLANT
GLOVE SURG UNDER POLY LF SZ7 (GLOVE) ×1 IMPLANT
GOWN STRL REUS W/ TWL LRG LVL3 (GOWN DISPOSABLE) ×2 IMPLANT
KIT PROCEDURE FLUENT (KITS) ×1 IMPLANT
KIT TURNOVER KIT B (KITS) ×1 IMPLANT
PACK VAGINAL MINOR WOMEN LF (CUSTOM PROCEDURE TRAY) ×1 IMPLANT
PAD OB MATERNITY 11 LF (PERSONAL CARE ITEMS) ×1 IMPLANT
SEAL ROD LENS SCOPE MYOSURE (ABLATOR) ×1 IMPLANT
TOWEL GREEN STERILE FF (TOWEL DISPOSABLE) ×2 IMPLANT
UNDERPAD 30X36 HEAVY ABSORB (UNDERPADS AND DIAPERS) ×1 IMPLANT

## 2024-05-27 NOTE — Anesthesia Preprocedure Evaluation (Signed)
 Anesthesia Evaluation  Patient identified by MRN, date of birth, ID band Patient awake    Reviewed: Allergy & Precautions, NPO status , Patient's Chart, lab work & pertinent test results  Airway Mallampati: II  TM Distance: >3 FB Neck ROM: Full    Dental no notable dental hx.    Pulmonary sleep apnea and Continuous Positive Airway Pressure Ventilation    Pulmonary exam normal        Cardiovascular Normal cardiovascular exam     Neuro/Psych  PSYCHIATRIC DISORDERS  Depression    negative neurological ROS     GI/Hepatic Neg liver ROS,GERD  Medicated and Controlled,,  Endo/Other  diabetes  Patient on GLP-1 Agonist  Renal/GU negative Renal ROS     Musculoskeletal   Abdominal  (+) + obese  Peds  Hematology negative hematology ROS (+)   Anesthesia Other Findings post menopausal bleeding  Reproductive/Obstetrics                              Anesthesia Physical Anesthesia Plan  ASA: 3  Anesthesia Plan: General   Post-op Pain Management:    Induction: Intravenous  PONV Risk Score and Plan: 3 and Ondansetron , Dexamethasone , Midazolam  and Treatment may vary due to age or medical condition  Airway Management Planned: LMA  Additional Equipment:   Intra-op Plan:   Post-operative Plan: Extubation in OR  Informed Consent: I have reviewed the patients History and Physical, chart, labs and discussed the procedure including the risks, benefits and alternatives for the proposed anesthesia with the patient or authorized representative who has indicated his/her understanding and acceptance.     Dental advisory given  Plan Discussed with: CRNA  Anesthesia Plan Comments:         Anesthesia Quick Evaluation

## 2024-05-27 NOTE — Discharge Instructions (Signed)

## 2024-05-27 NOTE — H&P (Signed)
 Natasha Briggs is an 53 y.o. female.   53 year old female presents for hysteroscopy, D&C for evaluation of irregular menstrual bleeding.  Patient reports some aspect of daily menstrual bleeding since March.  An endometrial biopsy in the office was performed and showed benign and active endometrium, no hyperplasia or malignancy.  Patient has history of a prior endometrial ablation in 2015.  Ultrasound in the office showed endometrium measuring approximately 1.4 cm.  However accurate measurement of the endometrium was difficult due to the patient's multiple uterine fibroids creating shadowing.  Recommendation for proceeding with hysteroscopy, D&C for complete evaluation of the endometrial cavity was made.  Risks/benefits/alternatives were reviewed.  Patient wishes to proceed.  TAUS: Anteverted uterus, bladder nondistended. Uterus measures 12.2 x 9 x 9.8 cm by abdominal route. Myometrium has heterogeneous appearance. At least 1 large fibroid noted within the anterior myometrium measuring 7.2 x 6 x 6 cm. A second smaller fibroid measuring 4.1 x 3 cm noted. Proceeding to vaginal imaging was required for more detailed evaluation of pelvic anatomy.  TVUS: Anteverted uterus. Endometrial thickness ill-defined due to anterior large fibroid. Best estimated measurement of 1.4 cm.  At least 3 fibroids measured  1) anterior intramural, FIGO III: 6.8 x 6.4 x 7.5 cm  2) right intramural, FIGO IV: 3.5 x 3.2 x 3.3 cm  3) left posterior intramural, FIGO IV: 2.4 x 1.9 x 2.3 cm  Other smaller appearing fibroids that were not measured were present. Cervix normal-appearing  Right ovary normal-appearing, left ovary not well-visualized. Small amount of free fluid in cul-de-sac.   Impression: Multiple intrauterine fibroids present. Fibroids make accurate measurement visualization of the endometrial thickness difficult.  - Endometrial biopsy was performed at patient's initial visit on 03/26/2024. Pathology results returned  SCANT SUPERFICIAL INACTIVE PATTERN ENDOMETRIUM - NO EVIDENCE OF UNOPPOSED ESTROGEN EFFECT (HYPERPLASIA) OR MALIGNANCY  No LMP recorded (lmp unknown). Patient is postmenopausal.    Past Medical History:  Diagnosis Date   Allergy    SEASONAL   Arthritis    LOWER BACK AND RIGHT KNEE   Depression    Diabetes mellitus without complication (HCC)    GERD (gastroesophageal reflux disease)    HPV (human papilloma virus) anogenital infection    Hypertension    Seasonal allergies    Sleep apnea    Vitamin D deficiency     Past Surgical History:  Procedure Laterality Date   DILATION AND CURETTAGE OF UTERUS     TUBAL LIGATION      Family History  Problem Relation Age of Onset   Sleep apnea Mother    Restless legs syndrome Mother    Colon polyps Paternal Grandmother    Colon cancer Neg Hx    Esophageal cancer Neg Hx    Rectal cancer Neg Hx    Stomach cancer Neg Hx     Social History:  reports that she has never smoked. She has never used smokeless tobacco. She reports current alcohol use. She reports that she does not use drugs.  Allergies: No Known Allergies  Medications Prior to Admission  Medication Sig Dispense Refill Last Dose/Taking   aspirin EC 81 MG tablet Take 81 mg by mouth daily. Swallow whole.   05/26/2024   desvenlafaxine (PRISTIQ) 50 MG 24 hr tablet Take 50 mg by mouth daily.   05/26/2024   famotidine (PEPCID) 10 MG tablet Take 10 mg by mouth 2 (two) times daily.   05/26/2024   loratadine (CLARITIN) 10 MG tablet Claritin 10 mg tablet   1 tablet  every day by oral route.   05/26/2024   Multiple Vitamin (MULTI-VITAMIN DAILY PO) daily.   05/26/2024   pravastatin (PRAVACHOL) 10 MG tablet pravastatin 10 mg tablet   1 tablet every day by oral route.   05/26/2024   acetaminophen  (TYLENOL ) 325 MG tablet Take 2 tablets (650 mg total) by mouth every 6 (six) hours as needed. 36 tablet 0 More than a month at 12:00 AM   cyclobenzaprine  (FLEXERIL ) 10 MG tablet Take 1 tablet (10 mg  total) by mouth 2 (two) times daily as needed for muscle spasms. 20 tablet 0 More than a month   doxycycline  (VIBRAMYCIN ) 100 MG capsule Take 1 capsule (100 mg total) by mouth 2 (two) times daily. (Patient not taking: Reported on 05/21/2024) 20 capsule 0 More than a month   guaiFENesin  (ROBITUSSIN) 100 MG/5ML SOLN Take 5 mLs (100 mg total) by mouth every 4 (four) hours as needed for cough or to loosen phlegm. 236 mL 0 More than a month   ibuprofen  (ADVIL ) 600 MG tablet Take 1 tablet (600 mg total) by mouth every 6 (six) hours as needed (take with food). (Patient taking differently: Take 600 mg by mouth as needed (take with food).) 30 tablet 0 More than a month   naproxen  (NAPROSYN ) 500 MG tablet Take 1 tablet (500 mg total) by mouth 2 (two) times daily with a meal. (Patient taking differently: Take 500 mg by mouth as needed.) 30 tablet 0 More than a month   pantoprazole (PROTONIX) 40 MG tablet daily. (Patient not taking: Reported on 05/21/2024)   More than a month   Tirzepatide (MOUNJARO [place]) Inject 10 mg into the skin once a week.   05/16/2024    Review of Systems  Blood pressure 115/79, pulse 74, temperature 98.2 F (36.8 C), temperature source Oral, resp. rate 17, height 5' 4 (1.626 m), weight 93.9 kg, SpO2 96%. Physical Exam AOx3, NAD Normal work of breathing Abd soft   Results for orders placed or performed during the hospital encounter of 05/27/24 (from the past 24 hours)  Glucose, capillary     Status: None   Collection Time: 05/27/24 10:21 AM  Result Value Ref Range   Glucose-Capillary 97 70 - 99 mg/dL  Hemoglobin J8r per protocol     Status: Abnormal   Collection Time: 05/27/24 10:22 AM  Result Value Ref Range   Hgb A1c MFr Bld 6.2 (H) 4.8 - 5.6 %   Mean Plasma Glucose 131.24 mg/dL  CBC     Status: Abnormal   Collection Time: 05/27/24 10:22 AM  Result Value Ref Range   WBC 5.0 4.0 - 10.5 K/uL   RBC 5.23 (H) 3.87 - 5.11 MIL/uL   Hemoglobin 15.3 (H) 12.0 - 15.0 g/dL   HCT 53.8  (H) 63.9 - 46.0 %   MCV 88.1 80.0 - 100.0 fL   MCH 29.3 26.0 - 34.0 pg   MCHC 33.2 30.0 - 36.0 g/dL   RDW 86.1 88.4 - 84.4 %   Platelets 298 150 - 400 K/uL   nRBC 0.0 0.0 - 0.2 %  Type and screen     Status: None   Collection Time: 05/27/24 10:22 AM  Result Value Ref Range   ABO/RH(D) AB POS    Antibody Screen NEG    Sample Expiration      05/30/2024,2359 Performed at [facility], [address] [place], [place] [address]   ABO/Rh     Status: None   Collection Time: 05/27/24 10:26  AM  Result Value Ref Range   ABO/RH(D)      AB POS Performed at [facility], [address] [place], [place] [address]     No results found.  Assessment/Plan: 1) proceed with hysteroscopy, dilation and curettage with resectoscope 2) appropriate informed consent obtained 3) SCDs for DVT prophylaxis. Marjorie Gull 05/27/2024, 11:52 AM

## 2024-05-27 NOTE — Transfer of Care (Signed)
 Immediate Anesthesia Transfer of Care Note  Patient: Natasha Briggs  Procedure(s) Performed: DILATATION & CURETTAGE/HYSTEROSCOPY (Uterus) MYOSURE RESECTION (Uterus)  Patient Location: PACU  Anesthesia Type:General  Level of Consciousness: awake  Airway & Oxygen  Therapy: Patient Spontanous Breathing and Patient connected to nasal cannula oxygen   Post-op Assessment: Report given to RN and Post -op Vital signs reviewed and stable  Post vital signs: Reviewed and stable  Last Vitals:  Vitals Value Taken Time  BP 122/67 05/27/24 14:11  Temp    Pulse 80 05/27/24 14:13  Resp 17 05/27/24 14:13  SpO2 100 % 05/27/24 14:13  Vitals shown include unfiled device data.  Last Pain:  Vitals:   05/27/24 0952  TempSrc: Oral  PainSc: 0-No pain      Patients Stated Pain Goal: 4 (05/27/24 9047)  Complications: No notable events documented.

## 2024-05-27 NOTE — Brief Op Note (Signed)
 05/27/2024  2:09 PM  PATIENT:  Natasha Briggs  53 y.o. female  PRE-OPERATIVE DIAGNOSIS:  post menopausal bleeding  POST-OPERATIVE DIAGNOSIS:  post menopausal bleeding  PROCEDURE:  Procedure(s): DILATATION & CURETTAGE/HYSTEROSCOPY (N/A) MYOSURE RESECTION (N/A)  SURGEON:  Surgeons and Role:    DEWAINE Okey Leader, MD - Primary  PHYSICIAN ASSISTANT:   ASSISTANTS: none   ANESTHESIA:   general  EBL:  Minimal  BLOOD ADMINISTERED:none  DRAINS: none   LOCAL MEDICATIONS USED:  LIDOCAINE    SPECIMEN:  Resected probable submucosal fibroid  DISPOSITION OF SPECIMEN:  PATHOLOGY  COUNTS:  YES  TOURNIQUET:  * No tourniquets in log *  DICTATION: .Dragon Dictation  PLAN OF CARE: Discharge to home after PACU  PATIENT DISPOSITION:  PACU - hemodynamically stable.   Delay start of Pharmacological VTE agent (>24hrs) due to surgical blood loss or risk of bleeding: not applicable

## 2024-05-27 NOTE — Anesthesia Procedure Notes (Signed)
 Procedure Name: LMA Insertion Date/Time: 05/27/2024 12:51 PM  Performed by: Obadiah Reyes BROCKS, CRNAPre-anesthesia Checklist: Patient identified, Emergency Drugs available, Suction available and Patient being monitored Patient Re-evaluated:Patient Re-evaluated prior to induction Oxygen  Delivery Method: Circle System Utilized Preoxygenation: Pre-oxygenation with 100% oxygen  Induction Type: IV induction Ventilation: Mask ventilation without difficulty LMA: LMA inserted LMA Size: 4.0 Number of attempts: 1 Airway Equipment and Method: Bite block Placement Confirmation: positive ETCO2 Tube secured with: Tape Dental Injury: Teeth and Oropharynx as per pre-operative assessment

## 2024-05-28 ENCOUNTER — Encounter (HOSPITAL_COMMUNITY): Payer: Self-pay | Admitting: Obstetrics and Gynecology

## 2024-05-28 NOTE — Op Note (Signed)
 Pre-Operative Diagnosis: 1) abnormal uterine bleeding 2) thickened endometrium 3) uterine fibroids Postoperative Diagnosis: 1) abnormal uterine bleeding 2) thickened endometrium 3) uterine fibroids Procedure: Hysteroscopy with resection of submucosal fibroid with MyoSure Surgeon: Dr. 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  2 ostia like areas noted on the surgeons left-hand side.  Unable to determine if 1 of these was the definitive tubal ostia or false ostia related to Asherman's.  Emanating from the posterior endometrial cavity wall a large submucosal fibroid filled the endometrial cavity and obscured the tubal ostia on the surgeon's right-hand side.  Following resection of the submucosal fibroid the tubal ostia on the surgeon's right could be definitively identified.  Fluid deficit 550 cc Specimen: Resected fibroid EBL: Minimal  Natasha Briggs Is a 53 year old female who presents for surgical management for abnormal uterine bleeding. Please see the patient's history and physical for complete details of the history. Management options were discussed with the patient. R/B/A reviewed. Following appropriate informed consent was taken to the operating room. The patient was appropriately identified during a time out procedure.  General anesthesia was administered and the patient was placed in the dorsal lithotomy position. The patient was prepped and draped in the normal sterile fashion. A speculum was placed into the vagina, a single-tooth tenaculum was placed on the anterior lip of the cervix, and 10 cc of 1% lidocaine  was administered in a paracervical fashion. The cervix was serially dilated with a combination of Hank dilators and Pratt dilators.  The hysteroscope was introduced for the above findings.  Initially, adequate distention was difficult to achieve and the mean pressure was increased from 80 mmHg to  90 mmHg.  This allowed for better distention of the endometrial cavity and visualization to proceed with surgical resection.  The MyoSure was introduced and used to resect the submucosal fibroid.  At the completion of the case the fibroid appeared to be completely resected and the right hand aspect of the uterine cavity had a normal appearance.  The suspected Asherman's on the left-hand aspect of the endometrial cavity was not addressed.  This completed the surgical procedure.  All sponge, instrument, needle counts were correct.  The patient was transferred to the PACU in stable condition following the procedure.

## 2024-05-28 NOTE — Anesthesia Postprocedure Evaluation (Signed)
 Anesthesia Post Note  Patient: Natasha Briggs  Procedure(s) Performed: DILATATION & CURETTAGE/HYSTEROSCOPY (Uterus) MYOSURE RESECTION (Uterus)     Patient location during evaluation: PACU Anesthesia Type: General Level of consciousness: awake Pain management: pain level controlled Vital Signs Assessment: post-procedure vital signs reviewed and stable Respiratory status: spontaneous breathing, nonlabored ventilation and respiratory function stable Cardiovascular status: blood pressure returned to baseline and stable Postop Assessment: no apparent nausea or vomiting Anesthetic complications: no   No notable events documented.  Last Vitals:  Vitals:   05/27/24 1500 05/27/24 1515  BP: 120/67 120/66  Pulse: 77 74  Resp: (!) 9 14  Temp: 36.7 C   SpO2: 99% 97%    Last Pain:  Vitals:   05/27/24 1457  TempSrc:   PainSc: 5                  Jahnyla Parrillo P Dare Sanger

## 2024-05-29 LAB — SURGICAL PATHOLOGY
